# Patient Record
Sex: Female | Born: 1945 | Race: White | Hispanic: No | Marital: Married | Smoking: Never smoker
Health system: Southern US, Community
[De-identification: ages and names within clinical notes are randomized; demographics above are authoritative.]

## PROBLEM LIST (undated history)

## (undated) DIAGNOSIS — E785 Hyperlipidemia, unspecified: Secondary | ICD-10-CM

## (undated) DIAGNOSIS — I10 Essential (primary) hypertension: Secondary | ICD-10-CM

## (undated) HISTORY — PX: TONSILLECTOMY: SUR1361

## (undated) HISTORY — PX: EYE SURGERY: SHX253

## (undated) HISTORY — DX: Hyperlipidemia, unspecified: E78.5

## (undated) HISTORY — PX: TUBAL LIGATION: SHX77

## (undated) HISTORY — DX: Essential (primary) hypertension: I10

---

## 1990-06-10 HISTORY — PX: CHOLECYSTECTOMY: SHX55

## 1995-06-11 HISTORY — PX: OTHER SURGICAL HISTORY: SHX169

## 1999-07-25 ENCOUNTER — Other Ambulatory Visit: Admission: RE | Admit: 1999-07-25 | Discharge: 1999-07-25 | Payer: Self-pay | Admitting: Family Medicine

## 1999-07-30 ENCOUNTER — Encounter: Admission: RE | Admit: 1999-07-30 | Discharge: 1999-07-30 | Payer: Self-pay | Admitting: Family Medicine

## 1999-07-30 ENCOUNTER — Encounter: Payer: Self-pay | Admitting: Family Medicine

## 2000-07-17 ENCOUNTER — Encounter: Payer: Self-pay | Admitting: Family Medicine

## 2000-09-18 ENCOUNTER — Other Ambulatory Visit: Admission: RE | Admit: 2000-09-18 | Discharge: 2000-09-18 | Payer: Self-pay | Admitting: Family Medicine

## 2001-10-20 ENCOUNTER — Other Ambulatory Visit: Admission: RE | Admit: 2001-10-20 | Discharge: 2001-10-20 | Payer: Self-pay | Admitting: Family Medicine

## 2003-01-24 ENCOUNTER — Other Ambulatory Visit: Admission: RE | Admit: 2003-01-24 | Discharge: 2003-01-24 | Payer: Self-pay | Admitting: Family Medicine

## 2004-01-31 ENCOUNTER — Other Ambulatory Visit: Admission: RE | Admit: 2004-01-31 | Discharge: 2004-01-31 | Payer: Self-pay | Admitting: Family Medicine

## 2004-07-18 ENCOUNTER — Ambulatory Visit: Payer: Self-pay | Admitting: Family Medicine

## 2004-12-19 ENCOUNTER — Ambulatory Visit: Payer: Self-pay | Admitting: Family Medicine

## 2004-12-24 ENCOUNTER — Ambulatory Visit: Payer: Self-pay | Admitting: Internal Medicine

## 2005-04-05 ENCOUNTER — Ambulatory Visit: Payer: Self-pay | Admitting: Family Medicine

## 2005-04-16 ENCOUNTER — Other Ambulatory Visit: Admission: RE | Admit: 2005-04-16 | Discharge: 2005-04-16 | Payer: Self-pay | Admitting: Family Medicine

## 2005-04-16 ENCOUNTER — Encounter: Payer: Self-pay | Admitting: Family Medicine

## 2005-04-16 ENCOUNTER — Ambulatory Visit: Payer: Self-pay | Admitting: Family Medicine

## 2005-06-10 LAB — HM COLONOSCOPY: HM Colonoscopy: NORMAL

## 2005-06-24 ENCOUNTER — Ambulatory Visit: Payer: Self-pay | Admitting: Internal Medicine

## 2005-07-10 ENCOUNTER — Ambulatory Visit: Payer: Self-pay | Admitting: Internal Medicine

## 2006-04-10 ENCOUNTER — Encounter: Payer: Self-pay | Admitting: Family Medicine

## 2006-04-10 LAB — CONVERTED CEMR LAB: Pap Smear: NORMAL

## 2006-05-09 ENCOUNTER — Encounter: Payer: Self-pay | Admitting: Family Medicine

## 2006-05-09 ENCOUNTER — Other Ambulatory Visit: Admission: RE | Admit: 2006-05-09 | Discharge: 2006-05-09 | Payer: Self-pay | Admitting: Family Medicine

## 2006-05-09 ENCOUNTER — Ambulatory Visit: Payer: Self-pay | Admitting: Family Medicine

## 2007-05-21 ENCOUNTER — Encounter (INDEPENDENT_AMBULATORY_CARE_PROVIDER_SITE_OTHER): Payer: Self-pay | Admitting: *Deleted

## 2007-05-26 ENCOUNTER — Encounter: Payer: Self-pay | Admitting: Family Medicine

## 2007-05-26 DIAGNOSIS — N951 Menopausal and female climacteric states: Secondary | ICD-10-CM

## 2007-05-26 DIAGNOSIS — E785 Hyperlipidemia, unspecified: Secondary | ICD-10-CM

## 2007-05-27 ENCOUNTER — Encounter: Payer: Self-pay | Admitting: Family Medicine

## 2007-05-27 ENCOUNTER — Other Ambulatory Visit: Admission: RE | Admit: 2007-05-27 | Discharge: 2007-05-27 | Payer: Self-pay | Admitting: Family Medicine

## 2007-05-27 ENCOUNTER — Ambulatory Visit: Payer: Self-pay | Admitting: Family Medicine

## 2007-05-27 DIAGNOSIS — R82998 Other abnormal findings in urine: Secondary | ICD-10-CM

## 2007-05-27 LAB — CONVERTED CEMR LAB
Bacteria, UA: 0
Bilirubin Urine: NEGATIVE
Casts: 0 /lpf
Cholesterol, target level: 200 mg/dL
Glucose, Urine, Semiquant: NEGATIVE
HDL goal, serum: 40 mg/dL
Ketones, urine, test strip: NEGATIVE
LDL Goal: 160 mg/dL
Mucus, UA: 0
Nitrite: NEGATIVE
Specific Gravity, Urine: 1.025
Urine crystals, microscopic: 0 /hpf
Urobilinogen, UA: NEGATIVE
WBC Urine, dipstick: NEGATIVE
Yeast, UA: 0
pH: 6.5

## 2007-05-28 LAB — CONVERTED CEMR LAB
ALT: 26 units/L (ref 0–35)
AST: 8 units/L (ref 0–37)
Albumin: 4.1 g/dL (ref 3.5–5.2)
Alkaline Phosphatase: 68 units/L (ref 39–117)
BUN: 11 mg/dL (ref 6–23)
Bilirubin, Direct: 0.1 mg/dL (ref 0.0–0.3)
CO2: 32 meq/L (ref 19–32)
Calcium: 9.1 mg/dL (ref 8.4–10.5)
Chloride: 105 meq/L (ref 96–112)
Cholesterol: 129 mg/dL (ref 0–200)
Creatinine, Ser: 0.8 mg/dL (ref 0.4–1.2)
GFR calc Af Amer: 93 mL/min
GFR calc non Af Amer: 77 mL/min
Glucose, Bld: 93 mg/dL (ref 70–99)
HDL: 44.8 mg/dL (ref >39.0)
LDL Cholesterol: 59 mg/dL (ref 0–99)
Potassium: 4.5 meq/L (ref 3.5–5.1)
Sodium: 141 meq/L (ref 135–145)
Total Bilirubin: 0.8 mg/dL (ref 0.3–1.2)
Total CHOL/HDL Ratio: 2.9
Total CK: 54 units/L (ref 7–177)
Total Protein: 7.5 g/dL (ref 6.0–8.3)
Triglycerides: 124 mg/dL (ref 0–149)
VLDL: 25 mg/dL (ref 0–40)

## 2007-06-08 ENCOUNTER — Encounter: Payer: Self-pay | Admitting: Family Medicine

## 2007-06-09 ENCOUNTER — Encounter (INDEPENDENT_AMBULATORY_CARE_PROVIDER_SITE_OTHER): Payer: Self-pay | Admitting: *Deleted

## 2007-06-16 ENCOUNTER — Encounter (INDEPENDENT_AMBULATORY_CARE_PROVIDER_SITE_OTHER): Payer: Self-pay | Admitting: *Deleted

## 2007-06-17 ENCOUNTER — Encounter (INDEPENDENT_AMBULATORY_CARE_PROVIDER_SITE_OTHER): Payer: Self-pay | Admitting: *Deleted

## 2007-08-17 ENCOUNTER — Ambulatory Visit: Payer: Self-pay | Admitting: Family Medicine

## 2007-08-17 DIAGNOSIS — R05 Cough: Secondary | ICD-10-CM

## 2008-06-15 ENCOUNTER — Encounter: Payer: Self-pay | Admitting: Family Medicine

## 2008-07-18 ENCOUNTER — Ambulatory Visit: Payer: Self-pay | Admitting: Family Medicine

## 2008-07-19 DIAGNOSIS — R7303 Prediabetes: Secondary | ICD-10-CM

## 2008-07-19 LAB — CONVERTED CEMR LAB
ALT: 42 units/L — ABNORMAL HIGH (ref 0–35)
AST: 16 units/L (ref 0–37)
Albumin: 4 g/dL (ref 3.5–5.2)
Alkaline Phosphatase: 76 units/L (ref 39–117)
BUN: 10 mg/dL (ref 6–23)
Bilirubin, Direct: 0.1 mg/dL (ref 0.0–0.3)
CO2: 30 meq/L (ref 19–32)
Calcium: 9.2 mg/dL (ref 8.4–10.5)
Chloride: 105 meq/L (ref 96–112)
Cholesterol: 152 mg/dL (ref 0–200)
Creatinine, Ser: 0.7 mg/dL (ref 0.4–1.2)
GFR calc Af Amer: 109 mL/min
GFR calc non Af Amer: 90 mL/min
Glucose, Bld: 105 mg/dL — ABNORMAL HIGH (ref 70–99)
HDL: 45.8 mg/dL (ref >39.0)
LDL Cholesterol: 73 mg/dL (ref 0–99)
Potassium: 4.3 meq/L (ref 3.5–5.1)
Sodium: 140 meq/L (ref 135–145)
Total Bilirubin: 0.9 mg/dL (ref 0.3–1.2)
Total CHOL/HDL Ratio: 3.3
Total Protein: 7.5 g/dL (ref 6.0–8.3)
Triglycerides: 168 mg/dL — ABNORMAL HIGH (ref 0–149)
VLDL: 34 mg/dL (ref 0–40)

## 2008-08-30 ENCOUNTER — Other Ambulatory Visit: Admission: RE | Admit: 2008-08-30 | Discharge: 2008-08-30 | Payer: Self-pay | Admitting: Family Medicine

## 2008-08-30 ENCOUNTER — Encounter: Payer: Self-pay | Admitting: Family Medicine

## 2008-08-30 ENCOUNTER — Ambulatory Visit: Payer: Self-pay | Admitting: Family Medicine

## 2008-08-30 DIAGNOSIS — K5900 Constipation, unspecified: Secondary | ICD-10-CM

## 2008-09-01 LAB — CONVERTED CEMR LAB
Basophils Absolute: 0 10*3/uL (ref 0.0–0.1)
Basophils Relative: 0 % (ref 0.0–3.0)
Eosinophils Absolute: 0.2 10*3/uL (ref 0.0–0.7)
Eosinophils Relative: 3.2 % (ref 0.0–5.0)
HCT: 39.4 % (ref 36.0–46.0)
Hemoglobin: 14 g/dL (ref 12.0–15.0)
Lymphocytes Relative: 39.3 % (ref 12.0–46.0)
Lymphs Abs: 2.6 10*3/uL (ref 0.7–4.0)
MCHC: 35.6 g/dL (ref 30.0–36.0)
MCV: 89.3 fL (ref 78.0–100.0)
Monocytes Absolute: 0.8 10*3/uL (ref 0.1–1.0)
Monocytes Relative: 12 % (ref 3.0–12.0)
Neutro Abs: 3 10*3/uL (ref 1.4–7.7)
Neutrophils Relative %: 45.5 % (ref 43.0–77.0)
Platelets: 267 10*3/uL (ref 150.0–400.0)
RBC: 4.41 M/uL (ref 3.87–5.11)
RDW: 12.4 % (ref 11.5–14.6)
TSH: 3.85 microintl units/mL (ref 0.35–5.50)
WBC: 6.6 10*3/uL (ref 4.5–10.5)

## 2008-09-05 ENCOUNTER — Encounter (INDEPENDENT_AMBULATORY_CARE_PROVIDER_SITE_OTHER): Payer: Self-pay | Admitting: *Deleted

## 2008-12-06 ENCOUNTER — Ambulatory Visit: Payer: Self-pay | Admitting: Family Medicine

## 2008-12-06 DIAGNOSIS — L259 Unspecified contact dermatitis, unspecified cause: Secondary | ICD-10-CM

## 2009-07-19 ENCOUNTER — Encounter: Payer: Self-pay | Admitting: Family Medicine

## 2009-07-31 ENCOUNTER — Encounter (INDEPENDENT_AMBULATORY_CARE_PROVIDER_SITE_OTHER): Payer: Self-pay | Admitting: *Deleted

## 2009-08-28 ENCOUNTER — Ambulatory Visit: Payer: Self-pay | Admitting: Family Medicine

## 2009-08-30 LAB — CONVERTED CEMR LAB
ALT: 23 units/L (ref 0–35)
AST: 7 units/L (ref 0–37)
Albumin: 4.1 g/dL (ref 3.5–5.2)
Alkaline Phosphatase: 93 units/L (ref 39–117)
BUN: 14 mg/dL (ref 6–23)
Bilirubin, Direct: 0 mg/dL (ref 0.0–0.3)
CO2: 30 meq/L (ref 19–32)
Calcium: 9.1 mg/dL (ref 8.4–10.5)
Chloride: 105 meq/L (ref 96–112)
Cholesterol: 155 mg/dL (ref 0–200)
Creatinine, Ser: 0.9 mg/dL (ref 0.4–1.2)
GFR calc non Af Amer: 66.93 mL/min (ref >60)
Glucose, Bld: 91 mg/dL (ref 70–99)
HDL: 55.1 mg/dL (ref >39.00)
LDL Cholesterol: 71 mg/dL (ref 0–99)
Potassium: 4.4 meq/L (ref 3.5–5.1)
Sodium: 142 meq/L (ref 135–145)
Total Bilirubin: 0.5 mg/dL (ref 0.3–1.2)
Total CHOL/HDL Ratio: 3
Total Protein: 7.3 g/dL (ref 6.0–8.3)
Triglycerides: 145 mg/dL (ref 0.0–149.0)
VLDL: 29 mg/dL (ref 0.0–40.0)

## 2009-09-01 ENCOUNTER — Ambulatory Visit: Payer: Self-pay | Admitting: Family Medicine

## 2010-07-10 NOTE — Assessment & Plan Note (Signed)
Summary: Natasha Mendoza   Vital Signs:  Patient profile:   65 year old female Height:      67 inches Weight:      150.0 pounds BMI:     23.58 Temp:     97.9 degrees F oral Pulse rate:   72 / minute Pulse rhythm:   regular BP sitting:   140 / 80  (left arm) Cuff size:   regular  Vitals Entered By: Benny Lennert CMA Duncan Dull) (September 01, 2009 11:16 AM)  History of Present Illness: Chief complaint cpx  The patient is here for annual wellness exam and preventative care.    Ding well overall.   Elevated BPs at home 130/70s.  [employer] 3-5 days a week. Healthy diet, fruits and veggies.   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 As Needed   - About 4/week 2)  Multivitamins   Tabs (Multiple Vitamin) .... Take 1 Tablet By Mouth Once A Day 3)  Simvastatin 20 Mg  Tabs (Simvastatin) .... Take 1 Tablet By Mouth Once A Day 4)  Adult Aspirin Ec Low Strength 81 Mg  Tbec (Aspirin) .... Take 1 Tablet By Mouth Once A Day 5)  Oscal 500/200 D-3 500-200 Mg-Unit Tabs (Calcium-Vitamin D) .... Daily 6)  B Complex-Folic Acid 500-5-200 Mcg-Mg-Mcg Tabs (Folic Acid-Vit B6-Vit B12) .... Daily 7)  Fish Oil 500 Mg Caps (Omega-3 Fatty Acids) .... Take 2  Tablet By Mouth Every 12 Hours 8)  Prednisone (Pak) 10 Mg Tabs (Prednisone) .... Use As Directed X 6days  Allergies: 1)  ! Erythromycin  Past History:  Past medical, surgical, family and social histories (including risk factors) reviewed, and no changes noted (except as noted below).  Past Medical  History: Reviewed history from 05/26/2007 and no changes required. Hyperlipidemia  Past Surgical History: Reviewed history from 05/26/2007 and no changes required. 1992    cholecystectomy 1997    right benign breast cyst             tonsillectomy  Family History: Reviewed history from 05/26/2007 and no changes required. Father: Died age 79 massive MI, high chol. Mother: Alive CVA Siblings:  No DM  Social History: Reviewed history from 05/27/2007 and no changes required. Marital Status: Married Children:  Occupation: Retired Exercise:  Elliptical  5 days per week  Review of Systems General:  Denies fatigue and fever. CV:  Denies chest pain or discomfort. Resp:  Denies shortness of breath, sputum productive, and wheezing. GI:  Denies abdominal pain, bloody stools, constipation, and diarrhea. GU:  Denies dysuria. Derm:  Denies rash. Psych:  Denies anxiety and depression. Endo:  Denies cold intolerance and heat intolerance.  Physical Exam  General:  Well-developed,well-nourished,in no acute distress; alert,appropriate and cooperative throughout examination Eyes:  No corneal or conjunctival inflammation noted. EOMI. Perrla. Funduscopic exam benign, without hemorrhages, exudates or papilledema. Vision grossly normal. Ears:  External ear exam shows no significant lesions or deformities.  Otoscopic examination reveals clear canals, tympanic membranes are intact bilaterally without bulging, retraction, inflammation or discharge. Hearing is grossly normal bilaterally. Nose:  External nasal examination  shows no deformity or inflammation. Nasal mucosa are pink and moist without lesions or exudates. Mouth:  Oral mucosa and oropharynx without lesions or exudates.  Teeth in good repair. Neck:  no carotid bruit or thyromegaly no cervical or supraclavicular lymphadenopathy  Chest Wall:  No deformities, masses, or tenderness noted. Breasts:  No mass, nodules, thickening, tenderness, bulging,  retraction, inflamation, nipple discharge or skin changes noted.   Lungs:  Normal respiratory effort, chest expands symmetrically. Lungs are clear to auscultation, no crackles or wheezes. Heart:  Normal rate and regular rhythm. S1 and S2 normal without gallop, murmur, click, rub or other extra sounds. Abdomen:  Bowel sounds positive,abdomen soft and non-tender without masses, organomegaly or hernias noted. Genitalia:  not indicated Msk:  No deformity or scoliosis noted of thoracic or lumbar spine.   Pulses:  R and L posterior tibial pulses are full and equal bilaterally  Extremities:  no edema  Skin:  Intact without suspicious lesions or rashes Psych:  Cognition and judgment appear intact. Alert and cooperative with normal attention span and concentration. No apparent delusions, illusions, hallucinations   Impression & Recommendations:  Problem # 1:  HEALTH MAINTENANCE EXAM (ICD-V70.0) The patient's preventative maintenance and recommended screening tests for an annual wellness exam were reviewed in full today. Brought up to date unless services declined.  Counselled on the importance of diet, exercise, and its role in overall health and mortality. The patient's FH and SH was reviewed, including their home life, tobacco status, and drug and alcohol status.     Complete Medication List: 1)  Eq Fiber Therapy 0.52 Gm Caps (Psyllium) .... As needed   - about 4/week 2)  Multivitamins Tabs (Multiple vitamin) .... Take 1 tablet by mouth once a day 3)  Simvastatin 20 Mg Tabs (Simvastatin) .... Take 1 tablet by mouth once a day 4)  Adult Aspirin Ec Low Strength 81 Mg Tbec (Aspirin) .... Take 1 tablet by mouth once a day 5)  Oscal 500/200 D-3 500-200 Mg-unit Tabs (Calcium-vitamin d) .... Daily 6)  B Complex-folic Acid 500-5-200 Mcg-mg-mcg Tabs (Folic acid-vit b6-vit b12) .... Daily 7)  Fish Oil 500 Mg Caps (Omega-3 fatty acids) .... Take 2  tablet by mouth every 12 hours 8)  Prednisone (pak) 10  Mg Tabs (Prednisone) .... Use as directed x 6days  Patient Instructions: 1)  Next year when scehduling mammogram..schedule bone density.   2)  Please schedule a follow-up appointment in 1 year.   Current Allergies (reviewed today): ! ERYTHROMYCIN  Last Flu Vaccine:  refused (05/27/2007 10:42:56 AM) Flu Vaccine Next Due:  Refused Last TD:  refused (05/27/2007 10:42:56 AM) TD Next Due:  Refused Herpes Zoster Next Due:  Refused Last PAP:  NEGATIVE FOR INTRAEPITHELIAL LESIONS OR MALIGNANCY. (08/30/2008 12:00:00 AM) PAP Next Due:  2 yr

## 2010-07-10 NOTE — Letter (Signed)
Summary: Results Follow up Letter  [facility]  [address] [place], [place] [address]   Phone: [phone]  Fax: [phone]    07/31/2009 MRN: [id number]     CONCHA SUDOL [address] [place], [place]  [address]    Dear Ms. Ty Hilts,  The following are the results of your recent test(s):  Test         Result    Pap Smear:        Normal _____  Not Normal _____ Comments: ______________________________________________________ Cholesterol: LDL(Bad cholesterol):         Your goal is less than:         HDL (Good cholesterol):       Your goal is more than: Comments:  ______________________________________________________ Mammogram:        Normal __x___  Not Normal _____ Comments:Repeat in 1 year  ___________________________________________________________________ Hemoccult:        Normal _____  Not normal _______ Comments:    _____________________________________________________________________ Other Tests:    We routinely do not discuss normal results over the telephone.  If you desire a copy of the results, or you have any questions about this information we can discuss them at your next office visit.   Sincerely,   Kerby Nora MD

## 2010-10-29 ENCOUNTER — Other Ambulatory Visit: Payer: Self-pay | Admitting: *Deleted

## 2010-10-29 MED ORDER — SIMVASTATIN 20 MG PO TABS: 20.0000 mg | ORAL_TABLET | Freq: Every day | ORAL | Status: DC

## 2010-12-11 ENCOUNTER — Telehealth: Payer: Self-pay | Admitting: Family Medicine

## 2010-12-11 DIAGNOSIS — E785 Hyperlipidemia, unspecified: Secondary | ICD-10-CM

## 2010-12-11 DIAGNOSIS — R7309 Other abnormal glucose: Secondary | ICD-10-CM

## 2010-12-11 NOTE — Telephone Encounter (Signed)
Message copied by Excell Seltzer on Tue Dec 11, 2010  1:49 PM ------      Message from: Baldomero Lamy      Created: Tue Dec 11, 2010 12:01 PM      Regarding: Cpx labs fri       Please order  future cpx labs for pt's upcomming lab appt.      Thanks      Rodney Booze

## 2010-12-12 ENCOUNTER — Encounter: Payer: Self-pay | Admitting: Family Medicine

## 2010-12-13 LAB — HM MAMMOGRAPHY: HM Mammogram: NORMAL

## 2010-12-14 ENCOUNTER — Other Ambulatory Visit (INDEPENDENT_AMBULATORY_CARE_PROVIDER_SITE_OTHER): Payer: Medicare Other | Admitting: Family Medicine

## 2010-12-14 DIAGNOSIS — E785 Hyperlipidemia, unspecified: Secondary | ICD-10-CM

## 2010-12-14 DIAGNOSIS — R7309 Other abnormal glucose: Secondary | ICD-10-CM

## 2010-12-14 LAB — COMPREHENSIVE METABOLIC PANEL
ALT: 23 U/L (ref 0–35)
Albumin: 4.5 g/dL (ref 3.5–5.2)
CO2: 30 mEq/L (ref 19–32)
Calcium: 9 mg/dL (ref 8.4–10.5)
Chloride: 107 mEq/L (ref 96–112)
GFR: 76.36 mL/min (ref >60.00)
Glucose, Bld: 102 mg/dL — ABNORMAL HIGH (ref 70–99)
Potassium: 4.5 mEq/L (ref 3.5–5.1)
Sodium: 141 mEq/L (ref 135–145)
Total Bilirubin: 0.6 mg/dL (ref 0.3–1.2)
Total Protein: 7.5 g/dL (ref 6.0–8.3)

## 2010-12-14 LAB — LIPID PANEL: VLDL: 24.6 mg/dL (ref 0.0–40.0)

## 2010-12-18 ENCOUNTER — Ambulatory Visit (INDEPENDENT_AMBULATORY_CARE_PROVIDER_SITE_OTHER): Payer: Medicare Other | Admitting: Family Medicine

## 2010-12-18 ENCOUNTER — Encounter: Payer: Self-pay | Admitting: Family Medicine

## 2010-12-18 DIAGNOSIS — Z0000 Encounter for general adult medical examination without abnormal findings: Secondary | ICD-10-CM

## 2010-12-18 DIAGNOSIS — R7309 Other abnormal glucose: Secondary | ICD-10-CM

## 2010-12-18 DIAGNOSIS — Z01419 Encounter for gynecological examination (general) (routine) without abnormal findings: Secondary | ICD-10-CM

## 2010-12-18 DIAGNOSIS — E785 Hyperlipidemia, unspecified: Secondary | ICD-10-CM

## 2010-12-18 DIAGNOSIS — N951 Menopausal and female climacteric states: Secondary | ICD-10-CM

## 2010-12-18 DIAGNOSIS — Z780 Asymptomatic menopausal state: Secondary | ICD-10-CM

## 2010-12-18 DIAGNOSIS — Z124 Encounter for screening for malignant neoplasm of cervix: Secondary | ICD-10-CM

## 2010-12-18 MED ORDER — SERTRALINE HCL 25 MG PO TABS: 25.0000 mg | ORAL_TABLET | Freq: Every day | ORAL | Status: DC

## 2010-12-18 NOTE — Patient Instructions (Addendum)
Decrease sweet fruits , carbohydrates. Stop all soda, juice. Follow BP at home.. Goal BP <140/90. Stop at front desk to schedule bone density.  Start sertraline for menopausal symptoms, call if no improvement in 1 month. If any side effects, given at least 1 week to let body get used to medication. Call if interested in stopping the medication. Do not stop it all of a sudden.

## 2010-12-18 NOTE — Assessment & Plan Note (Signed)
Not interested in HRT given Cardiac risk. HAs tried multiple OTC medicaitons without relief. We will start SSRI to treat.

## 2010-12-18 NOTE — Assessment & Plan Note (Signed)
Well controlled. Continue current medication. Liver function nml.

## 2010-12-18 NOTE — Progress Notes (Signed)
Subjective:    Patient ID: Natasha Mendoza, female    DOB: 05-14-1946, 65 y.o.   MRN: [id number]  HPI I have personally reviewed the Medicare Annual Wellness questionnaire and have noted 1. The patient's medical and social history 2. Their use of alcohol, tobacco or illicit drugs 3. Their current medications and supplements 4. The patient's functional ability including ADL's, fall risks, home safety risks and hearing or visual             impairment. 5. Diet and physical activities 6. Evidence for depression or mood disorders The patients weight, height, BMI and visual acuity have been recorded in the chart I have made referrals, counseling and provided education to the patient based review of the above and I have provided the pt with a written personalized care plan for preventive services.  Elevated Cholesterol: Well controlled on simvastatin 20 mg daily Using medications without problems: yes Muscle aches: None  Prediabetes, mild  Glucose 102   Menopausal syndrome: Difficulty sleeping, fatigue all due to  hot flashes.  .    Review of Systems  Constitutional: Negative for fever and fatigue.  HENT: Negative for ear pain.   Eyes: Negative for pain.  Respiratory: Negative for chest tightness and shortness of breath.   Cardiovascular: Negative for chest pain, palpitations and leg swelling.  Gastrointestinal: Positive for constipation. Negative for abdominal pain.  Genitourinary: Negative for dysuria, hematuria, vaginal bleeding, vaginal discharge, vaginal pain and pelvic pain.  Neurological: Negative for syncope.       Objective:   Physical Exam  Constitutional: Vital signs are normal. She appears well-developed and well-nourished. She is cooperative.  Non-toxic appearance. She does not appear ill. No distress.  HENT:  Head: Normocephalic.  Right Ear: Hearing, tympanic membrane, external ear and ear canal normal.  Left Ear: Hearing, tympanic membrane, external ear and  ear canal normal.  Nose: Nose normal.  Eyes: Conjunctivae, EOM and lids are normal. Pupils are equal, round, and reactive to light. No foreign bodies found.  Neck: Trachea normal and normal range of motion. Neck supple. Carotid bruit is not present. No mass and no thyromegaly present.  Cardiovascular: Normal rate, regular rhythm, S1 normal, S2 normal, normal heart sounds and intact distal pulses.  Exam reveals no gallop.   No murmur heard. Pulmonary/Chest: Effort normal and breath sounds normal. No respiratory distress. She has no wheezes. She has no rhonchi. She has no rales.  Abdominal: Soft. Normal appearance and bowel sounds are normal. She exhibits no distension, no fluid wave, no abdominal bruit and no mass. There is no hepatosplenomegaly. There is no tenderness. There is no rebound, no guarding and no CVA tenderness. No hernia.  Genitourinary: Rectum normal, vagina normal and uterus normal. No breast swelling, tenderness, discharge or bleeding. Pelvic exam was performed with patient prone. There is no rash, tenderness or lesion on the right labia. There is no rash, tenderness or lesion on the left labia. Uterus is not enlarged and not tender. Cervix exhibits no motion tenderness. Right adnexum displays no mass, no tenderness and no fullness. Left adnexum displays no mass, no tenderness and no fullness.       No pap performed  Lymphadenopathy:    She has no cervical adenopathy.    She has no axillary adenopathy.  Neurological: She is alert. She has normal strength. No cranial nerve deficit or sensory deficit.  Skin: Skin is warm, dry and intact. No rash noted.  Psychiatric: Her speech is normal and behavior is  normal. Judgment normal. Her mood appears not anxious. Cognition and memory are normal. She does not exhibit a depressed mood.          Assessment & Plan:  Annual Medicare Wellness: The patient's preventative maintenance and recommended screening tests for an annual wellness exam  were reviewed in full today. Brought up to date unless services declined.  Counselled on the importance of diet, exercise, and its role in overall health and mortality. The patient's FH and SH was reviewed, including their home life, tobacco status, and drug and alcohol status.   Last DEXA 2008 will schedule. LAst Colon 2007 Refuses any vaccines   recne mammogram nml.

## 2010-12-18 NOTE — Assessment & Plan Note (Signed)
Discussed diet and lifestyle changes to improve glucose. Rechek in 1 year.

## 2010-12-27 ENCOUNTER — Encounter: Payer: Self-pay | Admitting: Family Medicine

## 2011-01-01 LAB — HM DEXA SCAN

## 2011-01-04 ENCOUNTER — Encounter: Payer: Self-pay | Admitting: Family Medicine

## 2011-01-04 DIAGNOSIS — M810 Age-related osteoporosis without current pathological fracture: Secondary | ICD-10-CM | POA: Insufficient documentation

## 2011-01-04 DIAGNOSIS — M8580 Other specified disorders of bone density and structure, unspecified site: Secondary | ICD-10-CM

## 2011-01-15 ENCOUNTER — Telehealth: Payer: Self-pay | Admitting: *Deleted

## 2011-01-15 ENCOUNTER — Encounter: Payer: Self-pay | Admitting: *Deleted

## 2011-01-15 NOTE — Telephone Encounter (Signed)
Opened in error

## 2011-01-22 ENCOUNTER — Telehealth: Payer: Self-pay | Admitting: *Deleted

## 2011-01-22 MED ORDER — SERTRALINE HCL 50 MG PO TABS: 50.0000 mg | ORAL_TABLET | Freq: Every day | ORAL | Status: DC

## 2011-01-22 NOTE — Telephone Encounter (Signed)
The patient says she was prescribed Sertraline 25 mg about a month ago and was told to call in to give a report on her progress.  She does not feel that it is helping the post-menopausal symptoms at all.  She is due to have it refilled in about 5 days and if the dosage needs to be increased, she would like to have the new Rx prior to refilling.  Financial planner.

## 2011-01-22 NOTE — Telephone Encounter (Signed)
Increase to 50 mg daily. Sent in new Rx.

## 2011-01-23 NOTE — Telephone Encounter (Signed)
Patient advised.

## 2011-02-21 ENCOUNTER — Telehealth: Payer: Self-pay

## 2011-02-21 MED ORDER — SERTRALINE HCL 100 MG PO TABS: 100.0000 mg | ORAL_TABLET | Freq: Every day | ORAL | Status: DC

## 2011-02-21 NOTE — Telephone Encounter (Signed)
Increase to 100 mg for 1 month, call if not improved on this max dose, for change in med.

## 2011-02-21 NOTE — Telephone Encounter (Signed)
Patient advised.

## 2011-02-21 NOTE — Telephone Encounter (Signed)
Pt was taking Sertraline 25 mg for 1 month then med increased to Sertraline 50 mg for 1 month for menopausal symptoms and pt has  3 pills left and pt cannot see any improvement. Pt would like Dr Ermalene Searing to advise if need to  change dosage or change to a different med. Pt uses [facility] if pharmacy needed. Pt can be reached at [phone].

## 2011-03-04 ENCOUNTER — Encounter: Payer: Self-pay | Admitting: Family Medicine

## 2011-03-04 ENCOUNTER — Ambulatory Visit (INDEPENDENT_AMBULATORY_CARE_PROVIDER_SITE_OTHER): Payer: Medicare Other | Admitting: Family Medicine

## 2011-03-04 VITALS — BP 120/74 | HR 60 | Temp 98.0°F | Wt 158.2 lb

## 2011-03-04 DIAGNOSIS — R109 Unspecified abdominal pain: Secondary | ICD-10-CM

## 2011-03-04 LAB — CBC WITH DIFFERENTIAL/PLATELET
Basophils Relative: 0.5 % (ref 0.0–3.0)
Eosinophils Relative: 3.6 % (ref 0.0–5.0)
HCT: 43.7 % (ref 36.0–46.0)
Hemoglobin: 14.4 g/dL (ref 12.0–15.0)
Lymphs Abs: 2.7 10*3/uL (ref 0.7–4.0)
MCV: 94.3 fl (ref 78.0–100.0)
Monocytes Absolute: 0.9 10*3/uL (ref 0.1–1.0)
Monocytes Relative: 9.3 % (ref 3.0–12.0)
Neutro Abs: 5.4 10*3/uL (ref 1.4–7.7)
RBC: 4.63 Mil/uL (ref 3.87–5.11)
WBC: 9.3 10*3/uL (ref 4.5–10.5)

## 2011-03-04 LAB — HEPATIC FUNCTION PANEL
ALT: 42 U/L — ABNORMAL HIGH (ref 0–35)
AST: 14 U/L (ref 0–37)
Total Bilirubin: 0.8 mg/dL (ref 0.3–1.2)
Total Protein: 7.5 g/dL (ref 6.0–8.3)

## 2011-03-04 LAB — LIPASE: Lipase: 58 U/L (ref 11.0–59.0)

## 2011-03-04 NOTE — Progress Notes (Signed)
Subjective:    Patient ID: Natasha Mendoza, female    DOB: 1946-05-13, 65 y.o.   MRN: [id number]  HPI  Natasha Mendoza, a 65 y.o. female presents today in the office for the following:    Abdominal pain for about the last 2 weeks, primarily in the right upper quadrant as well as in the epigastric region. She has had 4 distinct episodes all in the epigastric and right upper quadrant region. She believes that this feels similar to her wall bladder pain that she had 20 years ago with before she had a cholecystectomy. She has not had any bloody stools or melena. No significant NSAID use, but does take a baby aspirin daily. No vomiting. No diarrhea. No fever. She is able to eat and drink, but her desire to eat and drink is decreased.  Had her gallbladder out about twenty years ago -- has had about four more attcks and pain in her abdomen. Has had to sit up and all in her upper abdomen. Constant pain and feeling full in her upper abdomen. Bra feels uncomfortable.   4 sharp attacks in RUQ.  Gallbladder out.   Feels hungry but feels full fast. No fever or chills.   Patient Active Problem List  Diagnoses  . HYPERLIPIDEMIA  . CONSTIPATION, CHRONIC  . MENOPAUSAL SYNDROME  . PREDIABETES  . URINALYSIS, ABNORMAL  . Osteopenia   Past Medical History  Diagnosis Date  . Hyperlipidemia    Past Surgical History  Procedure Date  . Cholecystectomy 1992  . Right benign breast cyst 1997  . Tonsillectomy    History  Substance Use Topics  . Smoking status: Never Smoker   . Smokeless tobacco: Not on file  . Alcohol Use: Not on file   Family History  Problem Relation Age of Onset  . Heart attack Father 82    massive  . Hyperlipidemia Father   . Stroke Mother   . Diabetes Neg Hx    Allergies  Allergen Reactions  . Erythromycin    Current Outpatient Prescriptions on File Prior to Visit  Medication Sig Dispense Refill  . aspirin 81 MG tablet Take 81 mg by mouth daily.        .  calcium-vitamin D (OSCAL WITH D) 500-200 MG-UNIT per tablet Take 1 tablet by mouth daily.        . Methylcellulose, Laxative, (FIBER THERAPY PO) Take 1 capsule by mouth. 4 times a week       . sertraline (ZOLOFT) 100 MG tablet Take 1 tablet (100 mg total) by mouth daily. For menopausal symptoms  30 tablet  5  . simvastatin (ZOCOR) 20 MG tablet Take 1 tablet (20 mg total) by mouth at bedtime.  30 tablet  11  . fish oil-omega-3 fatty acids 1000 MG capsule Take 2 g by mouth every 12 (twelve) hours.        . Folic Acid-Vit B6-Vit B12 (B COMPLEX-FOLIC ACID) 500-5-200 MCG-MG-MCG TABS Take 1 tablet by mouth daily.        . Multiple Vitamin (MULTIVITAMIN PO) Take 1 tablet by mouth daily.           Review of Systems ROS: GEN: Acute illness details above GI: Tolerating PO intake GU: maintaining adequate hydration and urination Pulm: No SOB Interactive and getting along well at home.  Otherwise, ROS is as per the HPI. k    Objective:   Physical Exam   Physical Exam  Blood pressure 120/74, pulse 60, temperature 98 F (  36.7 C), temperature source Oral, weight 158 lb 4 oz (71.782 kg).  GEN: WDWN, NAD, Non-toxic, A & O x 3 HEENT: Atraumatic, Normocephalic. Neck supple. No masses, No LAD. Ears and Nose: No external deformity. CV: RRR, No M/G/R. No JVD. No thrill. No extra heart sounds. PULM: CTA B, no wheezes, crackles, rhonchi. No retractions. No resp. distress. No accessory muscle use. ABD: S, mod tenderness RUQ and epigastric region, ND, +BS. No rebound tenderness. No HSM.  EXTR: No c/c/e NEURO Normal gait.  PSYCH: Normally interactive. Conversant. Not depressed or anxious appearing.  Calm demeanor.        Assessment & Plan:   1. Abdominal  pain, other specified site  Basic metabolic panel, CBC with Differential, Hepatic function panel, Lipase, H. pylori antibody, IgG, CT Abdomen Pelvis W Contrast    Abdominal pain of unclear source. Check basic laboratories. Given her symptoms of 2  weeks, and examination, check a CT of the abdomen and pelvis with contrast to evaluate for occult pathology.

## 2011-03-04 NOTE — Patient Instructions (Signed)
Stop aspirin Pepcid or Zantac twice a day  REFERRAL: GO THE THE FRONT ROOM AT THE ENTRANCE OF OUR CLINIC, NEAR CHECK IN. ASK FOR MARION. SHE WILL HELP YOU SET UP YOUR REFERRAL. DATE: TIME:

## 2011-03-05 LAB — BASIC METABOLIC PANEL
BUN: 16 mg/dL (ref 6–23)
Chloride: 101 mEq/L (ref 96–112)
GFR: 75.22 mL/min (ref >60.00)
Potassium: 5 mEq/L (ref 3.5–5.1)
Sodium: 139 mEq/L (ref 135–145)

## 2011-03-05 LAB — H. PYLORI ANTIBODY, IGG: H Pylori IgG: NEGATIVE

## 2011-03-06 ENCOUNTER — Ambulatory Visit (INDEPENDENT_AMBULATORY_CARE_PROVIDER_SITE_OTHER)
Admission: RE | Admit: 2011-03-06 | Discharge: 2011-03-06 | Disposition: A | Discharge status: Home / Self Care | Payer: Medicare Other | Source: Ambulatory Visit | Attending: Family Medicine | Admitting: Family Medicine

## 2011-03-06 ENCOUNTER — Other Ambulatory Visit: Payer: Self-pay | Admitting: Family Medicine

## 2011-03-06 DIAGNOSIS — R1011 Right upper quadrant pain: Secondary | ICD-10-CM

## 2011-03-06 DIAGNOSIS — R109 Unspecified abdominal pain: Secondary | ICD-10-CM

## 2011-03-06 MED ORDER — IOHEXOL 300 MG/ML  SOLN: 100.0000 mL | Freq: Once | INTRAMUSCULAR | Status: AC | PRN

## 2011-03-21 ENCOUNTER — Encounter: Payer: Self-pay | Admitting: Family Medicine

## 2011-03-21 ENCOUNTER — Ambulatory Visit (INDEPENDENT_AMBULATORY_CARE_PROVIDER_SITE_OTHER): Payer: Medicare Other | Admitting: Family Medicine

## 2011-03-21 DIAGNOSIS — R109 Unspecified abdominal pain: Secondary | ICD-10-CM

## 2011-03-21 DIAGNOSIS — R1013 Epigastric pain: Secondary | ICD-10-CM | POA: Insufficient documentation

## 2011-03-21 LAB — POCT URINALYSIS DIPSTICK
Blood, UA: NEGATIVE
Glucose, UA: NEGATIVE
Nitrite, UA: NEGATIVE
Spec Grav, UA: 1.02
Urobilinogen, UA: NEGATIVE
pH, UA: 7.5

## 2011-03-21 NOTE — Assessment & Plan Note (Addendum)
Given flank pain and urinary odor.. Will eval UA... Does not sound like typical urinary infection.  UA clear today.

## 2011-03-21 NOTE — Patient Instructions (Addendum)
Keep appt with Dr. Juanda Chance for further GI eval.  Stay off zoloft , fish oil ,asa for now... Will readdress menopausal symptoms and consider med restart once GI eval completed.

## 2011-03-21 NOTE — Progress Notes (Signed)
  Subjective:    Patient ID: Natasha Mendoza, female    DOB: 04-Jan-1946, 65 y.o.   MRN: [id number]  HPI   65 year old female with history of chronic constipation presents with fullness in epigastrum not related to eating.. Feels bloated. Mild epigastric pain, some pain in B flank pain mild. No further attacks as she was having after eating. She was seen 9/24 by Dr. Patsy Lager for right upper quadrant and epigastric pain... Labs including CMET, cbc, lipase and Hpylori were negative. Abd Pelvic CT was normal.  She has hx of cholescytectomy.  No dysuria, no urinary frequency. Has noted urine odor.  No SOB. No fever, no unexpected weight loss, no fatigue.    She has upcoming appt with Dr. Juanda Chance 10/28thish.  She noted the pain begin 1 week after increasing zoloft to 100 mg daily. She stopped zoloft, aspirin.   2 days after stopping the meds she had no further.  She is doing moderately fine with menopausal symptoms off zoloft...once other issues resolved we can consider other possible treatments for hot flashes.    Review of Systems  Constitutional: Negative for fever and fatigue.  HENT: Negative for ear pain.   Eyes: Negative for pain.  Respiratory: Negative for shortness of breath and wheezing.   Cardiovascular: Negative for chest pain, palpitations and leg swelling.  Genitourinary: Negative for dysuria, urgency, vaginal bleeding, vaginal discharge, vaginal pain and pelvic pain.       Objective:   Physical Exam  Constitutional: Vital signs are normal. She appears well-developed and well-nourished. She is cooperative.  Non-toxic appearance. She does not appear ill. No distress.  HENT:  Right Ear: Hearing, tympanic membrane and ear canal normal. Tympanic membrane is not erythematous, not retracted and not bulging.  Left Ear: Hearing, tympanic membrane and ear canal normal. Tympanic membrane is not erythematous, not retracted and not bulging.  Nose: No mucosal edema or rhinorrhea.  Right sinus exhibits no maxillary sinus tenderness and no frontal sinus tenderness. Left sinus exhibits no maxillary sinus tenderness and no frontal sinus tenderness.  Mouth/Throat: Uvula is midline and mucous membranes are normal.  Eyes: Lids are normal. No foreign bodies found.  Neck: Trachea normal. Carotid bruit is not present. No mass and no thyromegaly present.  Cardiovascular: Normal rate, regular rhythm, S1 normal, S2 normal, normal heart sounds, intact distal pulses and normal pulses.  Exam reveals no gallop and no friction rub.   No murmur heard. Pulmonary/Chest: Effort normal and breath sounds normal. Not tachypneic. No respiratory distress. She has no decreased breath sounds. She has no wheezes. She has no rhonchi. She has no rales.  Abdominal: Soft. Normal appearance and bowel sounds are normal. She exhibits no distension, no ascites and no mass. There is no hepatosplenomegaly. There is tenderness in the epigastric area. There is no CVA tenderness.  Neurological: She is alert.  Skin: Skin is warm, dry and intact. No rash noted.  Psychiatric: Her speech is normal and behavior is normal. Judgment and thought content normal. Her mood appears not anxious. Cognition and memory are normal. She does not exhibit a depressed mood.          Assessment & Plan:

## 2011-03-21 NOTE — Assessment & Plan Note (Signed)
Agree that this appears to be GI in origin... Symptomatology corresponds. Recommended keeping appt with Dr. Juanda Chance for likely endoscopy. Stay off meds including asa, zoloft as may have irritated stomach.

## 2011-04-08 ENCOUNTER — Encounter: Payer: Self-pay | Admitting: Internal Medicine

## 2011-04-08 ENCOUNTER — Ambulatory Visit (INDEPENDENT_AMBULATORY_CARE_PROVIDER_SITE_OTHER): Payer: Medicare Other | Admitting: Internal Medicine

## 2011-04-08 DIAGNOSIS — R1031 Right lower quadrant pain: Secondary | ICD-10-CM

## 2011-04-08 DIAGNOSIS — R1013 Epigastric pain: Secondary | ICD-10-CM

## 2011-04-08 MED ORDER — OMEPRAZOLE MAGNESIUM 20 MG PO TBEC: 20.0000 mg | DELAYED_RELEASE_TABLET | Freq: Every day | ORAL | Status: DC

## 2011-04-08 NOTE — Patient Instructions (Signed)
You have been scheduled for an endoscopy. Please follow written instructions given to you at your visit today. We have given you samples of Prilosec to take once daily.

## 2011-04-08 NOTE — Progress Notes (Signed)
Natasha Mendoza [birth year] MRN [id number]    History of Present Illness:  This is a 65 year old white female with a recent onset of subxiphoid and epigastric pain. The attack occurred in September 2012 and although she has gotten much better, she still has almost continuous discomfort in the substernal area. She has tried over-the-counter Pepcid without much improvement. She denies dysphagia or odynophagia. She denies using anti-inflammatory agents. The pain resembles her prior gallbladder attacks. She had a laparoscopic cholecystectomy by Dr Orpah Greek about 20 years ago. She had an ERCP by Dr. Jarold Motto in 1988 which was unremarkable. She had a screening colonoscopy in 2007 which was normal. A CT scan of the abdomen last month was negative.   Past Medical History  Diagnosis Date  . Hyperlipidemia   . Hemorrhoids   . Upper abdominal pain   . Constipation    Past Surgical History  Procedure Date  . Cholecystectomy 1992  . Right benign breast cyst 1997  . Tonsillectomy     reports that she has never smoked. She has never used smokeless tobacco. She reports that she does not drink alcohol or use illicit drugs. family history includes Heart attack (age of onset:39) in her father; Hyperlipidemia in her father; and Stroke in her mother.  There is no history of Diabetes and Colon cancer. Allergies  Allergen Reactions  . Erythromycin         Review of Systems:  The remainder of the 10 point ROS is negative except as outlined in H&P   Physical Exam: General appearance  Well developed, in no distress. Eyes- non icteric. HEENT nontraumatic, normocephalic. Mouth no lesions, tongue papillated, no cheilosis. Neck supple without adenopathy, thyroid not enlarged, no carotid bruits, no JVD. Lungs Clear to auscultation bilaterally. Cor normal S1, normal S2, regular rhythm, no murmur,  quiet precordium. Abdomen: Soft abdomen with minimal tenderness in the subxiphoid area, normal active  bowel sounds. No distention. Liver edge at costal margin. Rectal: Not done. Extremities no pedal edema. Skin no lesions. Neurological alert and oriented x 3. Psychological normal mood and affect.  Assessment and Plan:  Problem #1 Epigastric discomfort likely due to gastritis, hiatal hernia or hyperacidity. We will schedule her for an upper endoscopy and start her on samples of Prilosec 20 mg daily. She will follow antireflux measures. If symptoms don't improve, we will consider repeating an upper abdominal ultrasound to look for choledocholithiasis. A recent CT scan showed normal bile duct.   04/08/2011 Natasha Mendoza

## 2011-04-09 ENCOUNTER — Encounter: Payer: Self-pay | Admitting: Internal Medicine

## 2011-04-09 ENCOUNTER — Ambulatory Visit (AMBULATORY_SURGERY_CENTER): Payer: Medicare Other | Admitting: Internal Medicine

## 2011-04-09 VITALS — BP 153/79 | HR 61 | Temp 97.5°F | Resp 17 | Ht 66.0 in | Wt 162.0 lb

## 2011-04-09 DIAGNOSIS — K209 Esophagitis, unspecified without bleeding: Secondary | ICD-10-CM

## 2011-04-09 DIAGNOSIS — R1031 Right lower quadrant pain: Secondary | ICD-10-CM

## 2011-04-09 DIAGNOSIS — R1013 Epigastric pain: Secondary | ICD-10-CM

## 2011-04-09 DIAGNOSIS — K222 Esophageal obstruction: Secondary | ICD-10-CM

## 2011-04-09 DIAGNOSIS — K2100 Gastro-esophageal reflux disease with esophagitis, without bleeding: Secondary | ICD-10-CM

## 2011-04-09 MED ORDER — OMEPRAZOLE-SODIUM BICARBONATE 40-1100 MG PO CAPS: 1.0000 | ORAL_CAPSULE | Freq: Every day | ORAL | Status: DC

## 2011-04-09 MED ORDER — SODIUM CHLORIDE 0.9 % IV SOLN: 500.0000 mL | INTRAVENOUS | Status: DC

## 2011-04-09 NOTE — Patient Instructions (Signed)
FOLLOW YOUR DISCHARGE INSTRUCTIONS ON THE GREEN AND BLUE INSTRUCTION SHEETS.  FOLLOW THE POST DILATATION DIET:   NOTHING TO EAT OR DRINK UNTIL 1230 PM TODAY.   AFTER 1230 UNTIL 130 PM CLEAR LIQUIDS ONLY.   AFTER 130 PM FOR THE REMAINDER OF THE DAY SOFT FOODS ONLY.  CONTINUE YOUR MEDICATIONS. REPLACING ZEGRID 40 MG DAILY INSTEAD OF PRILOSEC 20 MG.  AWAIT BIOPSY RESULTS. DR. Juanda Chance WILL SEND YOU A LETTER WITH PATHOLOGY RESULTS.

## 2011-04-10 ENCOUNTER — Telehealth: Payer: Self-pay | Admitting: *Deleted

## 2011-04-10 NOTE — Telephone Encounter (Signed)
Follow up Call- Patient questions:  Do you have a fever, pain , or abdominal swelling? no Pain Score  0 *  Have you tolerated food without any problems? yes  Have you been able to return to your normal activities? yes  Do you have any questions about your discharge instructions: Diet   no Medications  no Follow up visit  no  Do you have questions or concerns about your Care? no  Actions: * If pain score is 4 or above: No action needed, pain <4. Patient stating able to swallow well yesterday, no appetite yesterday, has not eaten yet today. Denies any problems or concerns.

## 2011-04-15 ENCOUNTER — Encounter: Payer: Self-pay | Admitting: Internal Medicine

## 2011-10-28 ENCOUNTER — Other Ambulatory Visit: Payer: Self-pay | Admitting: *Deleted

## 2011-10-28 MED ORDER — SIMVASTATIN 20 MG PO TABS: 20.0000 mg | ORAL_TABLET | Freq: Every day | ORAL | Status: DC

## 2012-01-12 ENCOUNTER — Telehealth: Payer: Self-pay | Admitting: Family Medicine

## 2012-01-12 DIAGNOSIS — M8580 Other specified disorders of bone density and structure, unspecified site: Secondary | ICD-10-CM

## 2012-01-12 DIAGNOSIS — E785 Hyperlipidemia, unspecified: Secondary | ICD-10-CM

## 2012-01-12 NOTE — Telephone Encounter (Signed)
Message copied by Excell Seltzer on Sun Jan 12, 2012 11:37 PM ------      Message from: Alvina Chou      Created: Tue Jan 07, 2012  4:42 PM      Regarding: Lab orders for Tuesday, 8.6.13       Patient is scheduled for CPX labs, please order future labs, Thanks , Camelia Eng

## 2012-01-14 ENCOUNTER — Other Ambulatory Visit (INDEPENDENT_AMBULATORY_CARE_PROVIDER_SITE_OTHER): Payer: Medicare Other

## 2012-01-14 DIAGNOSIS — E785 Hyperlipidemia, unspecified: Secondary | ICD-10-CM | POA: Diagnosis not present

## 2012-01-14 DIAGNOSIS — M949 Disorder of cartilage, unspecified: Secondary | ICD-10-CM | POA: Diagnosis not present

## 2012-01-14 DIAGNOSIS — M8580 Other specified disorders of bone density and structure, unspecified site: Secondary | ICD-10-CM

## 2012-01-14 LAB — LIPID PANEL
Cholesterol: 148 mg/dL (ref 0–200)
LDL Cholesterol: 67 mg/dL (ref 0–99)
VLDL: 23.4 mg/dL (ref 0.0–40.0)

## 2012-01-14 LAB — COMPREHENSIVE METABOLIC PANEL
ALT: 23 U/L (ref 0–35)
Albumin: 4.1 g/dL (ref 3.5–5.2)
Alkaline Phosphatase: 70 U/L (ref 39–117)
CO2: 30 mEq/L (ref 19–32)
Glucose, Bld: 99 mg/dL (ref 70–99)
Potassium: 4.7 mEq/L (ref 3.5–5.1)
Sodium: 141 mEq/L (ref 135–145)
Total Protein: 7.3 g/dL (ref 6.0–8.3)

## 2012-01-15 LAB — VITAMIN D 25 HYDROXY (VIT D DEFICIENCY, FRACTURES): Vit D, 25-Hydroxy: 55 ng/mL (ref 30–89)

## 2012-01-21 ENCOUNTER — Encounter: Payer: Self-pay | Admitting: Family Medicine

## 2012-01-21 ENCOUNTER — Other Ambulatory Visit: Payer: Self-pay | Admitting: *Deleted

## 2012-01-21 ENCOUNTER — Ambulatory Visit (INDEPENDENT_AMBULATORY_CARE_PROVIDER_SITE_OTHER): Payer: Medicare Other | Admitting: Family Medicine

## 2012-01-21 VITALS — BP 120/78 | HR 79 | Temp 97.8°F | Ht 66.5 in | Wt 158.8 lb

## 2012-01-21 DIAGNOSIS — Z0000 Encounter for general adult medical examination without abnormal findings: Secondary | ICD-10-CM | POA: Diagnosis not present

## 2012-01-21 DIAGNOSIS — E785 Hyperlipidemia, unspecified: Secondary | ICD-10-CM

## 2012-01-21 DIAGNOSIS — M949 Disorder of cartilage, unspecified: Secondary | ICD-10-CM

## 2012-01-21 DIAGNOSIS — R7309 Other abnormal glucose: Secondary | ICD-10-CM | POA: Diagnosis not present

## 2012-01-21 DIAGNOSIS — M899 Disorder of bone, unspecified: Secondary | ICD-10-CM | POA: Diagnosis not present

## 2012-01-21 DIAGNOSIS — R1013 Epigastric pain: Secondary | ICD-10-CM

## 2012-01-21 DIAGNOSIS — M8580 Other specified disorders of bone density and structure, unspecified site: Secondary | ICD-10-CM

## 2012-01-21 MED ORDER — SIMVASTATIN 20 MG PO TABS: 20.0000 mg | ORAL_TABLET | Freq: Every day | ORAL | Status: DC

## 2012-01-21 NOTE — Progress Notes (Deleted)
  Subjective:    Patient ID: Natasha Mendoza, female    DOB: 01/27/1946, 66 y.o.   MRN: [id number]  HPI    Review of Systems     Objective:   Physical Exam        Assessment & Plan:

## 2012-01-21 NOTE — Assessment & Plan Note (Signed)
Counseled pt to restart zegrid for hiatal hernia and occult reflux as leaning forward may be causing increase abdominal pressure to worsen symptoms. Labs reviewed in detail. Call if not improving with this medication.

## 2012-01-21 NOTE — Progress Notes (Signed)
HPI  I have personally reviewed the Medicare Annual Wellness questionnaire and have noted  1. The patient's medical and social history  2. Their use of alcohol, tobacco or illicit drugs  3. Their current medications and supplements  4. The patient's functional ability including ADL's, fall risks, home safety risks and hearing or visual  impairment.  5. Diet and physical activities  6. Evidence for depression or mood disorders  The patients weight, height, BMI and visual acuity have been recorded in the chart  I have made referrals, counseling and provided education to the patient based review of the above and I have provided the pt with a written personalized care plan for preventive services.    She reports a recurrence of upper abdominal pain since 12/2010..last 30 minutes to 5 minutes at a time, occuring few times a day. Saw  Myself, Dr. Patsy Lager and Dr. Juanda Chance last year for abdominal pain.. Labs including CMET, cbc, lipase and Hpylori were negative. Abd Pelvic CT was normal.  She denies dysphagia or odynophagia. She denies using anti-inflammatory agents. The pain resembles her prior gallbladder attacks. She had a laparoscopic cholecystectomy by Dr Orpah Greek about 20 years ago. She had an ERCP by Dr. Jarold Motto in 1988 which was unremarkable.    Dr. Juanda Chance performed endoscopy in 2012.Had hiatal hernia and stricture was dilatedand started on zegris.[place] Pages this for a while, symptoms resolve but stopped taking because she felt better.  Notes more if bending over. No relationship to food, eating. Occ wakes her up in middle of night. She feels like she can relax it if she sits up and takes deep breaths.  Elevated Cholesterol: Well controlled on simvastatin 20 mg daily    Lab Results  Component Value Date   CHOL 148 01/14/2012   HDL 57.50 01/14/2012   LDLCALC 67 01/14/2012   TRIG 117.0 01/14/2012   CHOLHDL 3 01/14/2012   Using medications without problems: yes  Muscle aches: None   Exercsie: 5 days a  week, elliptical  Diet: healthy  Prediabetes,resolved  Menopausal syndrome: Trial of SSRI. Diud not help. Hot flashes and symptoms are tolerable. .  Review of Systems  Constitutional: Negative for fever and fatigue.  HENT: Negative for ear pain.  Eyes: Negative for pain.  Respiratory: Negative for chest tightness and shortness of breath.  Cardiovascular: Negative for chest pain, palpitations and leg swelling.  Gastrointestinal: Positive for constipation. Negative for abdominal pain.  Genitourinary: Negative for dysuria, hematuria, vaginal bleeding, vaginal discharge, vaginal pain and pelvic pain.  Neurological: Negative for syncope.    Objective:   Physical Exam  Constitutional: Vital signs are normal. She appears well-developed and well-nourished. She is cooperative. Non-toxic appearance. She does not appear ill. No distress.  HENT:  Head: Normocephalic.  Right Ear: Hearing, tympanic membrane, external ear and ear canal normal.  Left Ear: Hearing, tympanic membrane, external ear and ear canal normal.  Nose: Nose normal.  Eyes: Conjunctivae, EOM and lids are normal. Pupils are equal, round, and reactive to light. No foreign bodies found.  Neck: Trachea normal and normal range of motion. Neck supple. Carotid bruit is not present. No mass and no thyromegaly present.  Cardiovascular: Normal rate, regular rhythm, S1 normal, S2 normal, normal heart sounds and intact distal pulses. Exam reveals no gallop.  No murmur heard.  Pulmonary/Chest: Effort normal and breath sounds normal. No respiratory distress. She has no wheezes. She has no rhonchi. She has no rales.  Abdominal: Soft. Normal appearance and bowel sounds are normal.  She exhibits no distension, no fluid wave, no abdominal bruit and no mass. There is no hepatosplenomegaly. There is no tenderness. There is no rebound, no guarding and no CVA tenderness. No hernia.  Genitourinary:  NO DVE. No pap performed  Lymphadenopathy:  She has  no cervical adenopathy.  She has no axillary adenopathy.  Neurological: She is alert. She has normal strength. No cranial nerve deficit or sensory deficit.  Skin: Skin is warm, dry and intact. No rash noted.  Psychiatric: Her speech is normal and behavior is normal. Judgment normal. Her mood appears not anxious. Cognition and memory are normal. She does not exhibit a depressed mood.    Assessment & Plan:   Annual Medicare Wellness: The patient's preventative maintenance and recommended screening tests for an annual wellness exam were reviewed in full today.  Brought up to date unless services declined.  Counselled on the importance of diet, exercise, and its role in overall health and mortality.  The patient's FH and SH was reviewed, including their home life, tobacco status, and drug and alcohol status.   Last DEXA 12/2010 new disgnosis osteopenia Last Colon 2007 normal, Dr. Juanda Chance Refuses any vaccines.  12/2010 mammogram nml. Will schedule  DVE/PAP:  No pap indicated after age 28, DVEevery other year (not this year) No family uterine or ovarian cancer.

## 2012-01-21 NOTE — Assessment & Plan Note (Signed)
Well controlled. Continue current medication. Encouraged exercise, weight loss, healthy eating habits.  

## 2012-01-21 NOTE — Assessment & Plan Note (Signed)
Resolved

## 2012-01-21 NOTE — Patient Instructions (Addendum)
Schedule mammogram. Restart Zegrid.. Trial for 2 weeks. If symptoms not improving call or return to Dr. Juanda Chance for further evaluation. If helps call for refill to continue minimum of 4-6 weeks.

## 2012-03-13 ENCOUNTER — Telehealth: Payer: Self-pay | Admitting: *Deleted

## 2012-03-13 NOTE — Telephone Encounter (Signed)
Patient called requesting a itemized statement for DOS 01-21-12, she feels that she has been over billed. I asked our billing PC billing to please review, they feel that the visit was not billed appropriately and they have resubmitted the claim. I call patient back and explained that we are rebilling the claim and why. She questioned why she was billed a wellness visit and an office visit charge. I explained that the billing was appropriate because Dr. Ermalene Searing addressed a focused problem during the visit.

## 2012-05-01 ENCOUNTER — Other Ambulatory Visit: Payer: Self-pay

## 2012-05-01 MED ORDER — SIMVASTATIN 20 MG PO TABS: 20.0000 mg | ORAL_TABLET | Freq: Every day | ORAL | Status: DC

## 2012-05-01 NOTE — Telephone Encounter (Signed)
Pt request simvastatin # 90 due to insurance guidelines sent to [facility] as requested. Pt notified.

## 2013-01-15 ENCOUNTER — Telehealth: Payer: Self-pay | Admitting: Family Medicine

## 2013-01-15 DIAGNOSIS — R7309 Other abnormal glucose: Secondary | ICD-10-CM

## 2013-01-15 DIAGNOSIS — E785 Hyperlipidemia, unspecified: Secondary | ICD-10-CM

## 2013-01-15 DIAGNOSIS — M8580 Other specified disorders of bone density and structure, unspecified site: Secondary | ICD-10-CM

## 2013-01-15 NOTE — Telephone Encounter (Signed)
Message copied by Excell Seltzer on Fri Jan 15, 2013 10:38 AM ------      Message from: Alvina Chou      Created: Wed Jan 13, 2013 12:24 PM      Regarding: Lab orders for Monday, 8.11.14       Patient is scheduled for CPX labs, please order future labs, Thanks , Terri       ------

## 2013-01-18 ENCOUNTER — Other Ambulatory Visit (INDEPENDENT_AMBULATORY_CARE_PROVIDER_SITE_OTHER): Payer: Medicare Other

## 2013-01-18 DIAGNOSIS — M949 Disorder of cartilage, unspecified: Secondary | ICD-10-CM | POA: Diagnosis not present

## 2013-01-18 DIAGNOSIS — E785 Hyperlipidemia, unspecified: Secondary | ICD-10-CM | POA: Diagnosis not present

## 2013-01-18 DIAGNOSIS — R7309 Other abnormal glucose: Secondary | ICD-10-CM

## 2013-01-18 DIAGNOSIS — M8580 Other specified disorders of bone density and structure, unspecified site: Secondary | ICD-10-CM

## 2013-01-18 LAB — COMPREHENSIVE METABOLIC PANEL
ALT: 22 U/L (ref 0–35)
AST: 11 U/L (ref 0–37)
Albumin: 4.2 g/dL (ref 3.5–5.2)
Calcium: 9.3 mg/dL (ref 8.4–10.5)
Chloride: 105 mEq/L (ref 96–112)
Potassium: 4.6 mEq/L (ref 3.5–5.1)
Total Protein: 7.9 g/dL (ref 6.0–8.3)

## 2013-01-18 LAB — LIPID PANEL
LDL Cholesterol: 71 mg/dL (ref 0–99)
Total CHOL/HDL Ratio: 3

## 2013-01-21 ENCOUNTER — Encounter: Payer: Self-pay | Admitting: Family Medicine

## 2013-01-21 ENCOUNTER — Ambulatory Visit (INDEPENDENT_AMBULATORY_CARE_PROVIDER_SITE_OTHER): Payer: Medicare Other | Admitting: Family Medicine

## 2013-01-21 VITALS — BP 130/70 | HR 64 | Temp 97.5°F | Wt 160.0 lb

## 2013-01-21 DIAGNOSIS — M8580 Other specified disorders of bone density and structure, unspecified site: Secondary | ICD-10-CM

## 2013-01-21 DIAGNOSIS — Z0000 Encounter for general adult medical examination without abnormal findings: Secondary | ICD-10-CM

## 2013-01-21 DIAGNOSIS — R7309 Other abnormal glucose: Secondary | ICD-10-CM

## 2013-01-21 DIAGNOSIS — E785 Hyperlipidemia, unspecified: Secondary | ICD-10-CM

## 2013-01-21 NOTE — Patient Instructions (Addendum)
Schedule mammogram on your own.  Continue working on healthy eating,and regular exercsie.

## 2013-01-21 NOTE — Progress Notes (Signed)
HPI  I have personally reviewed the Medicare Annual Wellness questionnaire and have noted  1. The patient's medical and social history  2. Their use of alcohol, tobacco or illicit drugs  3. Their current medications and supplements  4. The patient's functional ability including ADL's, fall risks, home safety risks and hearing or visual  impairment.  5. Diet and physical activities  6. Evidence for depression or mood disorders  The patients weight, height, BMI and visual acuity have been recorded in the chart  I have made referrals, counseling and provided education to the patient based review of the above and I have provided the pt with a written personalized care plan for preventive services.    Elevated Cholesterol: Well controlled on simvastatin 20 mg daily  Lab Results  Component Value Date   CHOL 147 01/18/2013   HDL 50.20 01/18/2013   LDLCALC 71 01/18/2013   TRIG 128.0 01/18/2013   CHOLHDL 3 01/18/2013   Using medications without problems: yes  Muscle aches: None  Exercsie: 5 days a week, elliptical  Diet: healthy  Prediabetes,resolved   Menopausal syndrome: Tolerable hot flashes and symptoms are tolerable.  .  Review of Systems  Constitutional: Negative for fever and fatigue.  HENT: Negative for ear pain.  Eyes: Negative for pain.  Respiratory: Negative for chest tightness and shortness of breath.  Cardiovascular: Negative for chest pain, palpitations and leg swelling.  Gastrointestinal: Positive for constipation. Negative for abdominal pain.  Genitourinary: Negative for dysuria, hematuria, vaginal bleeding, vaginal discharge, vaginal pain and pelvic pain.  Neurological: Negative for syncope.  Objective:   Physical Exam  Constitutional: Vital signs are normal. She appears well-developed and well-nourished. She is cooperative. Non-toxic appearance. She does not appear ill. No distress.  HENT:  Head: Normocephalic.  Right Ear: Hearing, tympanic membrane, external ear and  ear canal normal.  Left Ear: Hearing, tympanic membrane, external ear and ear canal normal.  Nose: Nose normal.  Eyes: Conjunctivae, EOM and lids are normal. Pupils are equal, round, and reactive to light. No foreign bodies found.  Neck: Trachea normal and normal range of motion. Neck supple. Carotid bruit is not present. No mass and no thyromegaly present.  Cardiovascular: Normal rate, regular rhythm, S1 normal, S2 normal, normal heart sounds and intact distal pulses. Exam reveals no gallop.  No murmur heard.  Pulmonary/Chest: Effort normal and breath sounds normal. No respiratory distress. She has no wheezes. She has no rhonchi. She has no rales.  Abdominal: Soft. Normal appearance and bowel sounds are normal. She exhibits no distension, no fluid wave, no abdominal bruit and no mass. There is no hepatosplenomegaly. There is no tenderness. There is no rebound, no guarding and no CVA tenderness. No hernia.  Genitourinary: NO DVE. No pap performed   breast: B no masses, no nipple changes. Lymphadenopathy:  She has no cervical adenopathy.  She has no axillary adenopathy.  Neurological: She is alert. She has normal strength. No cranial nerve deficit or sensory deficit.  Skin: Skin is warm, dry and intact. No rash noted.  Psychiatric: Her speech is normal and behavior is normal. Judgment normal. Her mood appears not anxious. Cognition and memory are normal. She does not exhibit a depressed mood.  Assessment & Plan:   Annual Medicare Wellness: The patient's preventative maintenance and recommended screening tests for an annual wellness exam were reviewed in full today.  Brought up to date unless services declined.  Counselled on the importance of diet, exercise, and its role in overall health  and mortality.  The patient's FH and SH was reviewed, including their home life, tobacco status, and drug and alcohol status.   Last DEXA 12/2010 new disgnosis osteopenia,  Scheduled next week. Last Colon 2007  normal, Dr. Juanda Chance, 10 year repeat Refuses any vaccines including PNA, shingles and tetanus 12/2010 mammogram nml. Overdue. Scheuled on her own. DVE/PAP: No pap indicated after age 4, DVE refused to continue. No family uterine or ovarian cancer.

## 2013-01-27 ENCOUNTER — Other Ambulatory Visit: Payer: Self-pay | Admitting: Family Medicine

## 2013-01-28 DIAGNOSIS — Z1231 Encounter for screening mammogram for malignant neoplasm of breast: Secondary | ICD-10-CM | POA: Diagnosis not present

## 2013-01-28 DIAGNOSIS — M899 Disorder of bone, unspecified: Secondary | ICD-10-CM | POA: Diagnosis not present

## 2013-02-01 ENCOUNTER — Encounter: Payer: Self-pay | Admitting: Family Medicine

## 2013-02-02 ENCOUNTER — Encounter: Payer: Self-pay | Admitting: Family Medicine

## 2013-02-03 ENCOUNTER — Encounter: Payer: Self-pay | Admitting: *Deleted

## 2013-04-15 ENCOUNTER — Other Ambulatory Visit: Payer: Self-pay

## 2013-06-19 IMAGING — CT CT ABD-PELV W/ CM
2 of 5 series · 17 of 46 positions shown, 19 images · IV contrast (Omnipaque 300)
Comparison: None.

CLINICAL DATA: Right upper quadrant pain, cholecystectomy

CT ABDOMEN AND PELVIS WITH CONTRAST
TECHNIQUE: Multidetector CT imaging of the abdomen and pelvis was
performed following the standard protocol during bolus
administration of intravenous contrast.
Contrast:  100 ml Omnipaque 300

[Series 2: abd/ pel 5mm · axial · 0.72mm/px · z∈[-487,-47]mm · 14 of 98 slices shown, 16 images]
[im 5/98  soft-tissue]
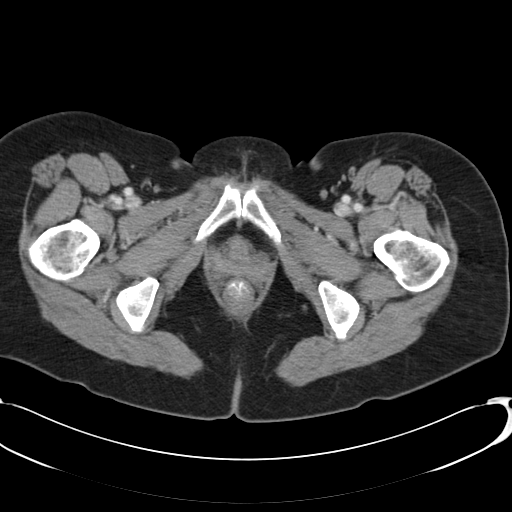
[im 5/98  bone]
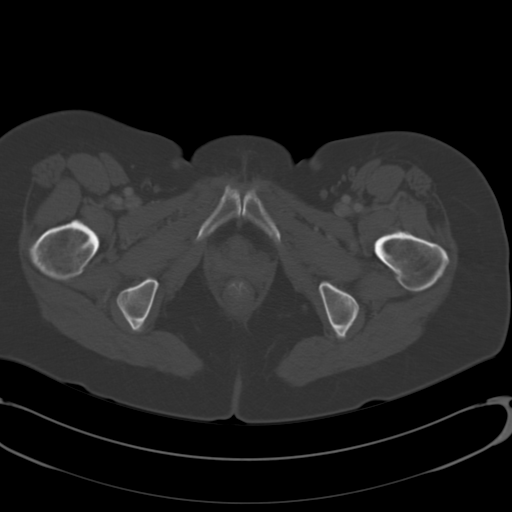
[im 14/98  soft-tissue]
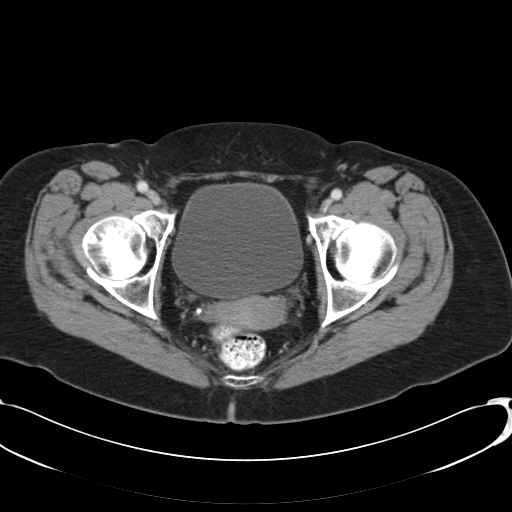
[im 19/98  soft-tissue]
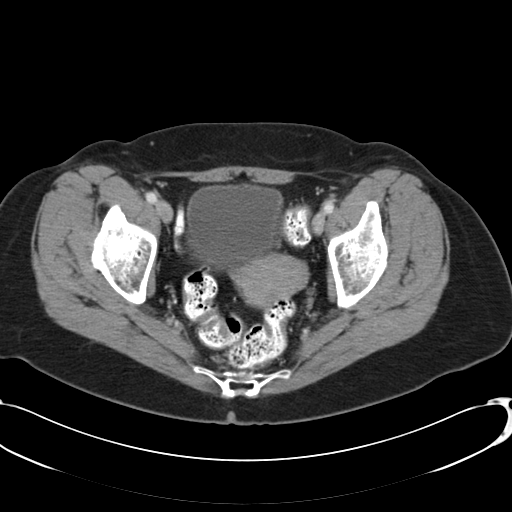
[im 28/98  soft-tissue]
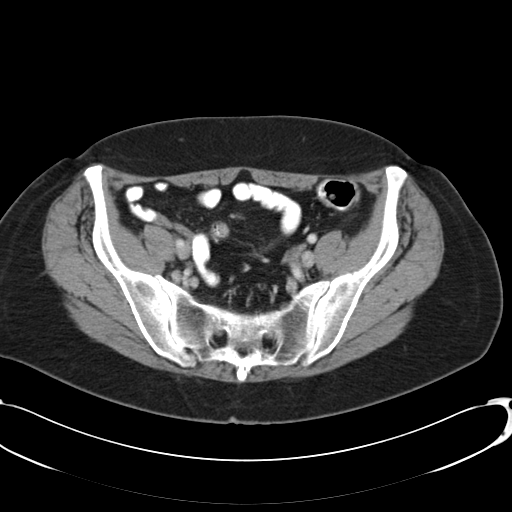
[im 33/98  soft-tissue]
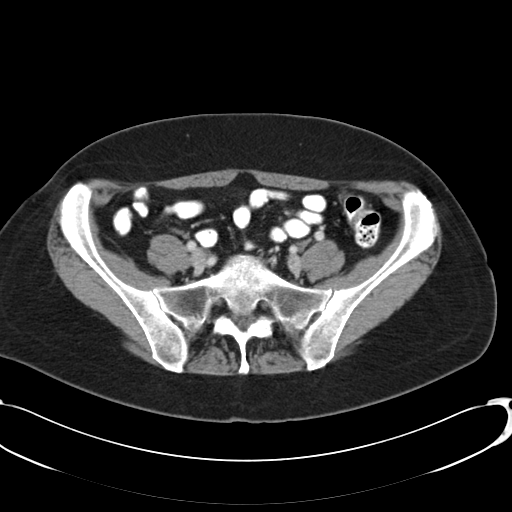
[im 37/98  soft-tissue]
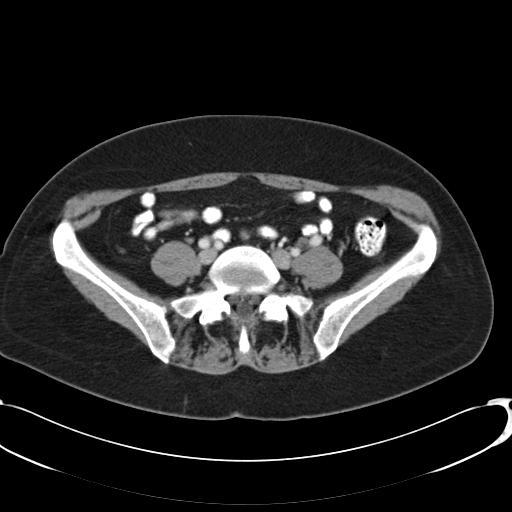
[im 47/98  soft-tissue]
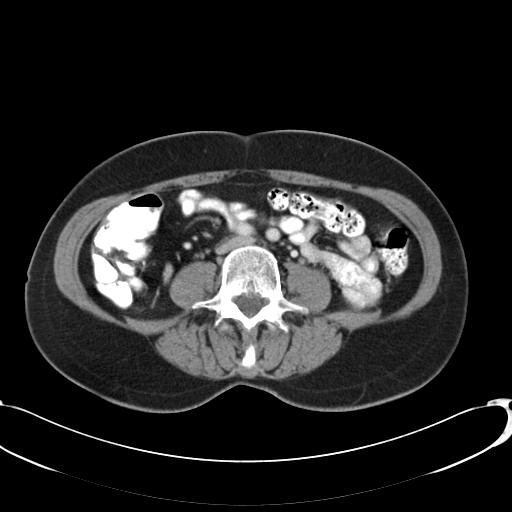
[im 51/98  soft-tissue]
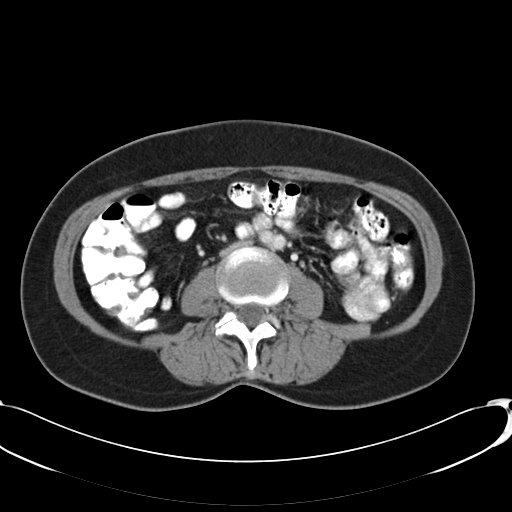
[im 61/98  soft-tissue]
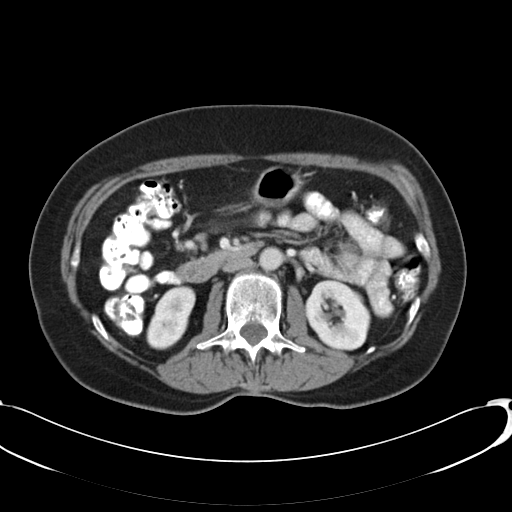
[im 61/98  bone]
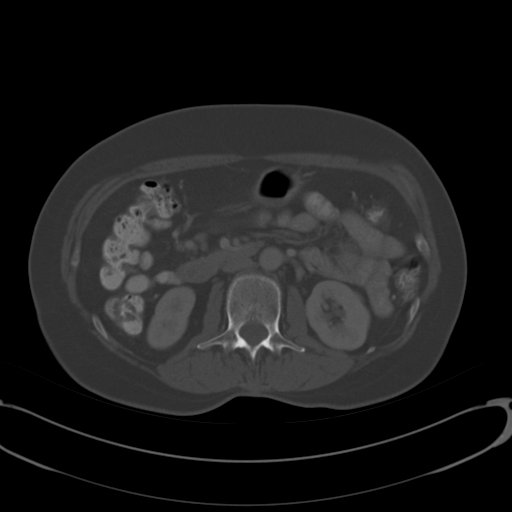
[im 65/98  soft-tissue]
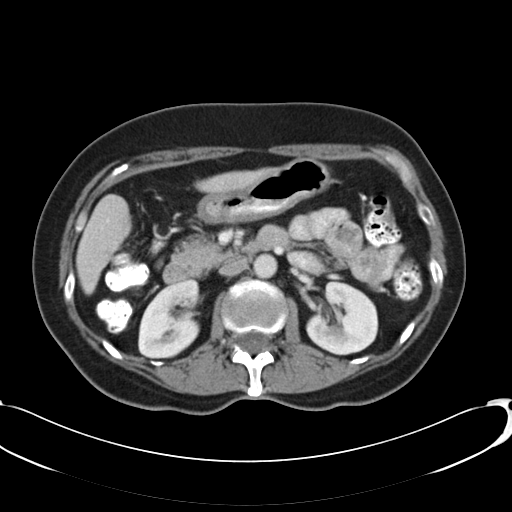
[im 74/98  soft-tissue]
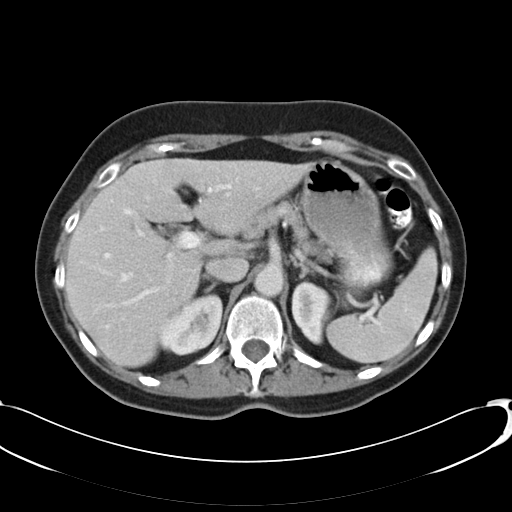
[im 79/98  soft-tissue]
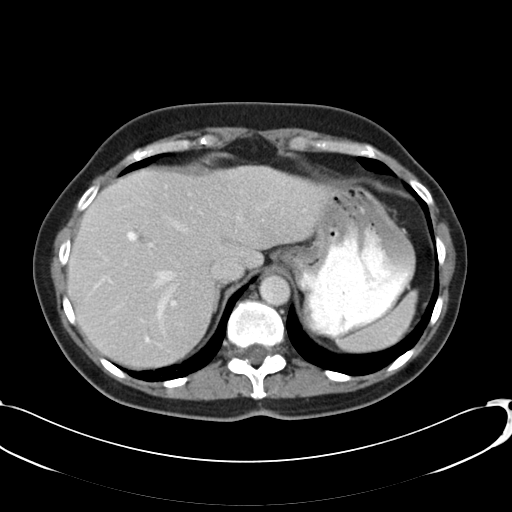
[im 84/98  soft-tissue]
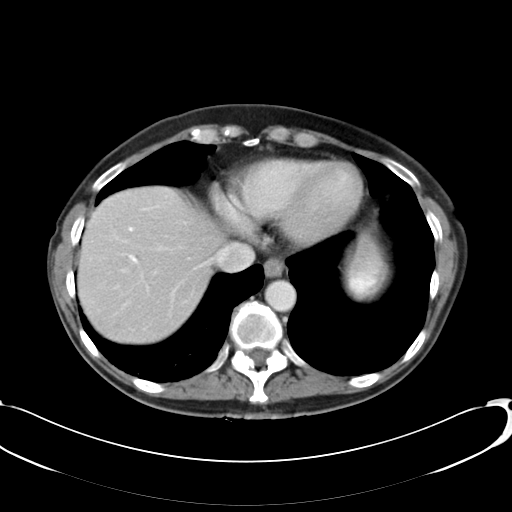
[im 93/98  soft-tissue]
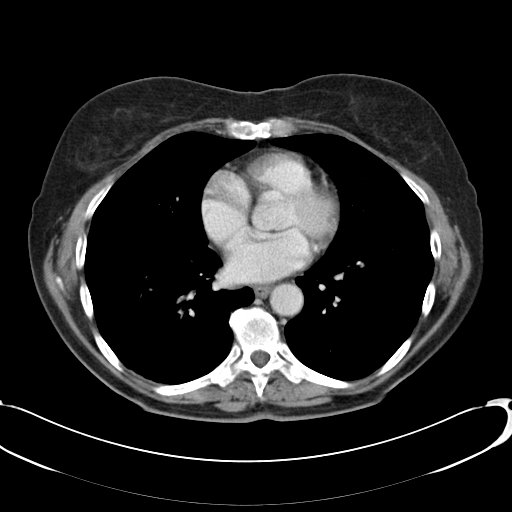

[Series 602: cor · coronal · 0.98mm/px · 3 of 106 slices shown]
[im 36/106  soft-tissue]
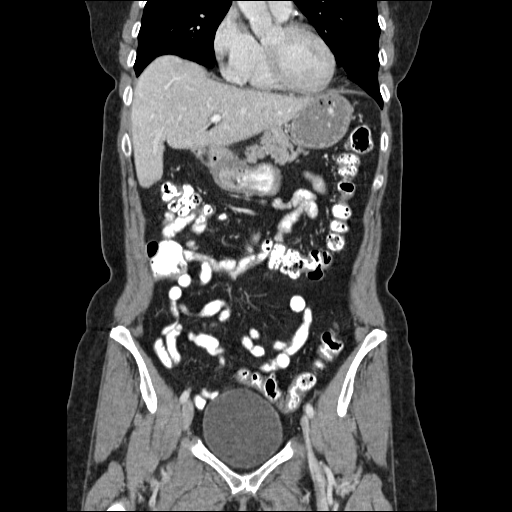
[im 47/106  soft-tissue]
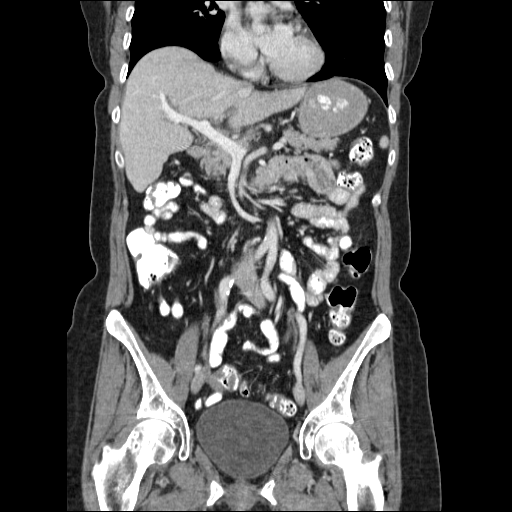
[im 59/106  soft-tissue]
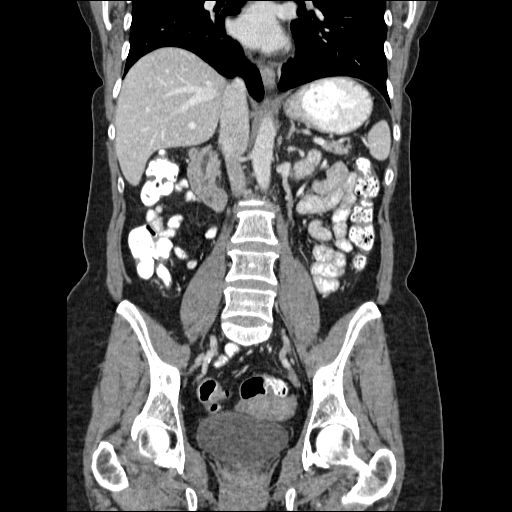

[17 of 46 positions shown; findings below may reference images not displayed]

FINDINGS: Lung bases are clear.  No pericardial fluid.

No focal hepatic lesion.  Post cholecystectomy.  Pancreas is
normal.  The common bile duct is at the upper limits of normal at 5
mm.  The spleen, adrenal glands, and kidneys are normal.

The stomach, small bowel, appendix, cecum are normal.  The colon
and rectosigmoid colon are normal.

Abdominal aorta normal caliber.  No retroperitoneal
lymphadenopathy.

No free fluid the pelvis.  The bladder and uterus are normal.  The
ovaries are normal for age.  Small 11 mm cyst within the right
ovary.  No pelvic lymphadenopathy. Review of  bone windows
demonstrates no aggressive osseous lesions.  A benign-appearing
sclerotic lesion within the right sacrum and and right ilium.
Hemangiomas within the thoracic spine.
IMPRESSION: 1.  No acute abdominal or pelvic findings.
2.  Cholecystectomy

## 2013-09-09 DIAGNOSIS — L821 Other seborrheic keratosis: Secondary | ICD-10-CM | POA: Diagnosis not present

## 2013-09-09 DIAGNOSIS — L570 Actinic keratosis: Secondary | ICD-10-CM | POA: Diagnosis not present

## 2014-02-01 ENCOUNTER — Other Ambulatory Visit: Payer: Self-pay | Admitting: Family Medicine

## 2014-02-11 DIAGNOSIS — Z1231 Encounter for screening mammogram for malignant neoplasm of breast: Secondary | ICD-10-CM | POA: Diagnosis not present

## 2014-02-15 DIAGNOSIS — H2510 Age-related nuclear cataract, unspecified eye: Secondary | ICD-10-CM | POA: Diagnosis not present

## 2014-02-16 ENCOUNTER — Encounter: Payer: Self-pay | Admitting: Family Medicine

## 2014-03-09 ENCOUNTER — Telehealth: Payer: Self-pay | Admitting: Family Medicine

## 2014-03-09 DIAGNOSIS — R7303 Prediabetes: Secondary | ICD-10-CM

## 2014-03-09 DIAGNOSIS — E785 Hyperlipidemia, unspecified: Secondary | ICD-10-CM

## 2014-03-09 DIAGNOSIS — M8580 Other specified disorders of bone density and structure, unspecified site: Secondary | ICD-10-CM

## 2014-03-09 NOTE — Telephone Encounter (Signed)
Message copied by Jinny Sanders on Wed Mar 09, 2014 11:14 PM ------      Message from: Ellamae Sia      Created: Thu Mar 03, 2014 12:48 PM      Regarding: Lab orders for [facility], 10.1.15       Patient is scheduled for CPX labs, please order future labs, Thanks , Terri       ------

## 2014-03-10 ENCOUNTER — Other Ambulatory Visit (INDEPENDENT_AMBULATORY_CARE_PROVIDER_SITE_OTHER): Payer: Medicare Other

## 2014-03-10 DIAGNOSIS — R7309 Other abnormal glucose: Secondary | ICD-10-CM

## 2014-03-10 DIAGNOSIS — M8580 Other specified disorders of bone density and structure, unspecified site: Secondary | ICD-10-CM | POA: Diagnosis not present

## 2014-03-10 DIAGNOSIS — R7303 Prediabetes: Secondary | ICD-10-CM

## 2014-03-10 DIAGNOSIS — E785 Hyperlipidemia, unspecified: Secondary | ICD-10-CM

## 2014-03-10 LAB — COMPREHENSIVE METABOLIC PANEL
ALBUMIN: 4.1 g/dL (ref 3.5–5.2)
ALT: 24 U/L (ref 0–35)
AST: 9 U/L (ref 0–37)
Alkaline Phosphatase: 70 U/L (ref 39–117)
BUN: 12 mg/dL (ref 6–23)
CALCIUM: 9.1 mg/dL (ref 8.4–10.5)
CHLORIDE: 106 meq/L (ref 96–112)
CO2: 31 meq/L (ref 19–32)
Creatinine, Ser: 0.7 mg/dL (ref 0.4–1.2)
GFR: 86.78 mL/min (ref >60.00)
GLUCOSE: 93 mg/dL (ref 70–99)
Potassium: 3.7 mEq/L (ref 3.5–5.1)
Sodium: 140 mEq/L (ref 135–145)
Total Bilirubin: 0.9 mg/dL (ref 0.2–1.2)
Total Protein: 7.8 g/dL (ref 6.0–8.3)

## 2014-03-10 LAB — LIPID PANEL
CHOLESTEROL: 164 mg/dL (ref 0–200)
HDL: 47.5 mg/dL (ref >39.00)
LDL CALC: 89 mg/dL (ref 0–99)
NonHDL: 116.5
Total CHOL/HDL Ratio: 3
Triglycerides: 140 mg/dL (ref 0.0–149.0)
VLDL: 28 mg/dL (ref 0.0–40.0)

## 2014-03-10 LAB — VITAMIN D 25 HYDROXY (VIT D DEFICIENCY, FRACTURES): VITD: 54.98 ng/mL (ref 30.00–100.00)

## 2014-03-10 LAB — HEMOGLOBIN A1C: HEMOGLOBIN A1C: 5.9 % (ref 4.6–6.5)

## 2014-03-14 DIAGNOSIS — L570 Actinic keratosis: Secondary | ICD-10-CM | POA: Diagnosis not present

## 2014-03-14 DIAGNOSIS — D485 Neoplasm of uncertain behavior of skin: Secondary | ICD-10-CM | POA: Diagnosis not present

## 2014-03-14 DIAGNOSIS — L821 Other seborrheic keratosis: Secondary | ICD-10-CM | POA: Diagnosis not present

## 2014-03-17 ENCOUNTER — Encounter: Payer: Self-pay | Admitting: Family Medicine

## 2014-03-17 ENCOUNTER — Ambulatory Visit (INDEPENDENT_AMBULATORY_CARE_PROVIDER_SITE_OTHER): Payer: Medicare Other | Admitting: Family Medicine

## 2014-03-17 VITALS — BP 136/82 | HR 74 | Temp 98.2°F | Ht 67.0 in | Wt 163.2 lb

## 2014-03-17 DIAGNOSIS — Z0000 Encounter for general adult medical examination without abnormal findings: Secondary | ICD-10-CM | POA: Diagnosis not present

## 2014-03-17 DIAGNOSIS — E785 Hyperlipidemia, unspecified: Secondary | ICD-10-CM | POA: Diagnosis not present

## 2014-03-17 NOTE — Assessment & Plan Note (Signed)
Well controlled. Continue current medication.  

## 2014-03-17 NOTE — Patient Instructions (Signed)
Keep working on healthy eating and regular exercise! 

## 2014-03-17 NOTE — Progress Notes (Signed)
HPI  I have personally reviewed the Medicare Annual Wellness questionnaire and have noted  1. The patient's medical and social history  2. Their use of alcohol, tobacco or illicit drugs  3. Their current medications and supplements  4. The patient's functional ability including ADL's, fall risks, home safety risks and hearing or visual  impairment.  5. Diet and physical activities  6. Evidence for depression or mood disorders  The patients weight, height, BMI and visual acuity have been recorded in the chart  I have made referrals, counseling and provided education to the patient based review of the above and I have provided the pt with a written personalized care plan for preventive services.   Feeling well overall, no specific concerns.  Elevated Cholesterol: Well controlled on simvastatin 20 mg daily  Lab Results  Component Value Date   CHOL 164 03/10/2014   HDL 47.50 03/10/2014   LDLCALC 89 03/10/2014   TRIG 140.0 03/10/2014   CHOLHDL 3 03/10/2014  Using medications without problems: yes  Muscle aches: None  Exercsie: 5 days a week, elliptical  Diet: healthy, some trouble with carb   Prediabetes,resolved  Lab Results  Component Value Date   HGBA1C 5.9 03/10/2014     BP Readings from Last 3 Encounters:  03/17/14 136/82  01/21/13 130/70  01/21/12 120/78     Menopausal syndrome: Trial of SSRI. Diud not help. Hot flashes and symptoms are tolerable.  .  Review of Systems  Constitutional: Negative for fever and fatigue.  HENT: Negative for ear pain.  Eyes: Negative for pain.  Respiratory: Negative for chest tightness and shortness of breath.  Cardiovascular: Negative for chest pain, palpitations and leg swelling.  Gastrointestinal: Positive for constipation, chronic. Negative for abdominal pain.  Genitourinary: Negative for dysuria, hematuria, vaginal bleeding, vaginal discharge, vaginal pain and pelvic pain.  Neurological: Negative for syncope.  Objective:   Physical  Exam  Constitutional: Vital signs are normal. She appears well-developed and well-nourished. She is cooperative. Non-toxic appearance. She does not appear ill. No distress.  HENT:  Head: Normocephalic.  Right Ear: Hearing, tympanic membrane, external ear and ear canal normal.  Left Ear: Hearing, tympanic membrane, external ear and ear canal normal.  Nose: Nose normal.  Eyes: Conjunctivae, EOM and lids are normal. Pupils are equal, round, and reactive to light. No foreign bodies found.  Neck: Trachea normal and normal range of motion. Neck supple. Carotid bruit is not present. No mass and no thyromegaly present.  Cardiovascular: Normal rate, regular rhythm, S1 normal, S2 normal, normal heart sounds and intact distal pulses. Exam reveals no gallop.  No murmur heard.  Pulmonary/Chest: Effort normal and breath sounds normal. No respiratory distress. She has no wheezes. She has no rhonchi. She has no rales.  Abdominal: Soft. Normal appearance and bowel sounds are normal. She exhibits no distension, no fluid wave, no abdominal bruit and no mass. There is no hepatosplenomegaly. There is no tenderness. There is no rebound, no guarding and no CVA tenderness. No hernia.  Genitourinary: NO DVE. No pap performed  Lymphadenopathy:  She has no cervical adenopathy.  She has no axillary adenopathy.  Neurological: She is alert. She has normal strength. No cranial nerve deficit or sensory deficit.  Skin: Skin is warm, dry and intact. No rash noted.  Psychiatric: Her speech is normal and behavior is normal. Judgment normal. Her mood appears not anxious. Cognition and memory are normal. She does not exhibit a depressed mood.  Assessment & Plan:   Annual Medicare  Wellness: The patient's preventative maintenance and recommended screening tests for an annual wellness exam were reviewed in full today.  Brought up to date unless services declined.  Counselled on the importance of diet, exercise, and its role in  overall health and mortality.  The patient's FH and SH was reviewed, including their home life, tobacco status, and drug and alcohol status.   Last DEXA 01/2013 new disgnosis osteopenia 2012,stable on repeat, plan repeat  in 5 years. Last Colon 2007 normal, Dr. Olevia Perches, 10 year repeat  Refuses any vaccines including PNA, shingles and tetanus  02/2014 mammogram nml.  DVE/PAP: No pap indicated after age 89, DVE refused to continue. No family uterine or ovarian cancer.

## 2014-04-01 DIAGNOSIS — H2510 Age-related nuclear cataract, unspecified eye: Secondary | ICD-10-CM | POA: Diagnosis not present

## 2014-05-03 ENCOUNTER — Other Ambulatory Visit: Payer: Self-pay | Admitting: Family Medicine

## 2014-06-17 ENCOUNTER — Encounter: Payer: Self-pay | Admitting: Family Medicine

## 2014-08-22 DIAGNOSIS — N10 Acute tubulo-interstitial nephritis: Secondary | ICD-10-CM | POA: Diagnosis not present

## 2014-12-05 ENCOUNTER — Other Ambulatory Visit: Payer: Self-pay

## 2014-12-12 DIAGNOSIS — J209 Acute bronchitis, unspecified: Secondary | ICD-10-CM | POA: Diagnosis not present

## 2015-01-24 DIAGNOSIS — H2513 Age-related nuclear cataract, bilateral: Secondary | ICD-10-CM | POA: Diagnosis not present

## 2015-03-07 DIAGNOSIS — H25011 Cortical age-related cataract, right eye: Secondary | ICD-10-CM | POA: Diagnosis not present

## 2015-03-07 DIAGNOSIS — H18411 Arcus senilis, right eye: Secondary | ICD-10-CM | POA: Diagnosis not present

## 2015-03-07 DIAGNOSIS — H2511 Age-related nuclear cataract, right eye: Secondary | ICD-10-CM | POA: Diagnosis not present

## 2015-03-07 DIAGNOSIS — H2512 Age-related nuclear cataract, left eye: Secondary | ICD-10-CM | POA: Diagnosis not present

## 2015-03-10 ENCOUNTER — Encounter: Payer: Self-pay | Admitting: Family Medicine

## 2015-03-13 ENCOUNTER — Other Ambulatory Visit: Payer: Self-pay | Admitting: Family Medicine

## 2015-03-13 DIAGNOSIS — M8580 Other specified disorders of bone density and structure, unspecified site: Secondary | ICD-10-CM

## 2015-03-13 DIAGNOSIS — E663 Overweight: Secondary | ICD-10-CM

## 2015-03-13 DIAGNOSIS — E785 Hyperlipidemia, unspecified: Secondary | ICD-10-CM

## 2015-03-15 ENCOUNTER — Other Ambulatory Visit (INDEPENDENT_AMBULATORY_CARE_PROVIDER_SITE_OTHER): Payer: Medicare Other

## 2015-03-15 DIAGNOSIS — M8580 Other specified disorders of bone density and structure, unspecified site: Secondary | ICD-10-CM | POA: Diagnosis not present

## 2015-03-15 DIAGNOSIS — E785 Hyperlipidemia, unspecified: Secondary | ICD-10-CM

## 2015-03-15 DIAGNOSIS — E663 Overweight: Secondary | ICD-10-CM | POA: Diagnosis not present

## 2015-03-15 LAB — LIPID PANEL
CHOL/HDL RATIO: 3
Cholesterol: 162 mg/dL (ref 0–200)
HDL: 57.7 mg/dL (ref >39.00)
LDL CALC: 85 mg/dL (ref 0–99)
NONHDL: 104.72
TRIGLYCERIDES: 100 mg/dL (ref 0.0–149.0)
VLDL: 20 mg/dL (ref 0.0–40.0)

## 2015-03-15 LAB — COMPREHENSIVE METABOLIC PANEL
ALT: 28 U/L (ref 0–35)
AST: 12 U/L (ref 0–37)
Albumin: 4.2 g/dL (ref 3.5–5.2)
Alkaline Phosphatase: 80 U/L (ref 39–117)
BUN: 15 mg/dL (ref 6–23)
CALCIUM: 9.5 mg/dL (ref 8.4–10.5)
CHLORIDE: 104 meq/L (ref 96–112)
CO2: 31 meq/L (ref 19–32)
Creatinine, Ser: 0.79 mg/dL (ref 0.40–1.20)
GFR: 76.5 mL/min (ref >60.00)
GLUCOSE: 103 mg/dL — AB (ref 70–99)
Potassium: 4.6 mEq/L (ref 3.5–5.1)
Sodium: 140 mEq/L (ref 135–145)
Total Bilirubin: 0.5 mg/dL (ref 0.2–1.2)
Total Protein: 7.5 g/dL (ref 6.0–8.3)

## 2015-03-15 LAB — CBC
HCT: 43.5 % (ref 36.0–46.0)
HEMOGLOBIN: 14.7 g/dL (ref 12.0–15.0)
MCHC: 33.8 g/dL (ref 30.0–36.0)
MCV: 90.6 fl (ref 78.0–100.0)
PLATELETS: 291 10*3/uL (ref 150.0–400.0)
RBC: 4.8 Mil/uL (ref 3.87–5.11)
RDW: 13.3 % (ref 11.5–15.5)
WBC: 6.5 10*3/uL (ref 4.0–10.5)

## 2015-03-20 ENCOUNTER — Encounter: Payer: Self-pay | Admitting: Family Medicine

## 2015-03-20 ENCOUNTER — Ambulatory Visit (INDEPENDENT_AMBULATORY_CARE_PROVIDER_SITE_OTHER): Payer: Medicare Other | Admitting: Family Medicine

## 2015-03-20 VITALS — BP 144/96 | HR 65 | Temp 98.0°F | Ht 67.75 in | Wt 162.4 lb

## 2015-03-20 DIAGNOSIS — IMO0001 Reserved for inherently not codable concepts without codable children: Secondary | ICD-10-CM

## 2015-03-20 DIAGNOSIS — M8580 Other specified disorders of bone density and structure, unspecified site: Secondary | ICD-10-CM | POA: Diagnosis not present

## 2015-03-20 DIAGNOSIS — K5909 Other constipation: Secondary | ICD-10-CM

## 2015-03-20 DIAGNOSIS — E785 Hyperlipidemia, unspecified: Secondary | ICD-10-CM

## 2015-03-20 DIAGNOSIS — Z0000 Encounter for general adult medical examination without abnormal findings: Secondary | ICD-10-CM

## 2015-03-20 DIAGNOSIS — R030 Elevated blood-pressure reading, without diagnosis of hypertension: Secondary | ICD-10-CM

## 2015-03-20 MED ORDER — POLYETHYLENE GLYCOL 3350 17 GM/SCOOP PO POWD: 17.0000 g | Freq: Two times a day (BID) | ORAL | Status: DC | PRN

## 2015-03-20 NOTE — Assessment & Plan Note (Signed)
Bone density scan ordered.

## 2015-03-20 NOTE — Assessment & Plan Note (Signed)
BP elevated today. Recheck in 2 weeks.

## 2015-03-20 NOTE — Assessment & Plan Note (Signed)
Advised daily MiraLAX. Rx given

## 2015-03-20 NOTE — Progress Notes (Signed)
Subjective:    Natasha Mendoza is a 69 y.o. female who presents for Medicare Annual/Subsequent preventive examination.  She has no current complaints and states that she is doing well.   Preventive Screening-Counseling & Management  Tobacco History  Smoking status  . Never Smoker   Smokeless tobacco  . Never Used    Current Problems (verified) Patient Active Problem List   Diagnosis Date Noted  . Elevated BP 03/20/2015  . Osteopenia 01/04/2011  . CONSTIPATION, CHRONIC 08/30/2008  . Hyperlipidemia 05/26/2007   Medications Prior to Visit Current Outpatient Prescriptions on File Prior to Visit  Medication Sig Dispense Refill  . aspirin 81 MG tablet Take 81 mg by mouth daily.    Mariane Baumgarten Sodium (COLACE PO) Take 1 tablet by mouth daily.    . simvastatin (ZOCOR) 20 MG tablet TAKE ONE TABLET BY MOUTH EVERY NIGHT AT BEDTIME 90 tablet 3   No current facility-administered medications on file prior to visit.   Current Medications (verified) Current Outpatient Prescriptions  Medication Sig Dispense Refill  . aspirin 81 MG tablet Take 81 mg by mouth daily.    Mariane Baumgarten Sodium (COLACE PO) Take 1 tablet by mouth daily.    . simvastatin (ZOCOR) 20 MG tablet TAKE ONE TABLET BY MOUTH EVERY NIGHT AT BEDTIME 90 tablet 3  . polyethylene glycol powder (GLYCOLAX/MIRALAX) powder Take 17 g by mouth 2 (two) times daily as needed. 850 g 1   No current facility-administered medications for this visit.    Allergies (verified) Erythromycin   PAST HISTORY  Family History Family History  Problem Relation Age of Onset  . Heart attack Father 83    massive  . Hyperlipidemia Father   . Stroke Mother   . Diabetes Neg Hx   . Colon cancer Neg Hx    Social History Social History  Substance Use Topics  . Smoking status: Never Smoker   . Smokeless tobacco: Never Used  . Alcohol Use: 0.6 oz/week    1 Glasses of wine per week     Comment: occasionally    Are there smokers in your home  (other than you)? No  Risk Factors Current exercise habits: Gym/ health club routine includes cardio and light weights. 5 days a week.  Dietary issues discussed: Well balanced diet.    Cardiac risk factors: advanced age (older than 41 for men, 86 for women), dyslipidemia and family history of premature cardiovascular disease.  Depression Screen (Note: if answer to either of the following is "Yes", a more complete depression screening is indicated)   Over the past two weeks, have you felt down, depressed or hopeless? No  Over the past two weeks, have you felt little interest or pleasure in doing things? No  Have you lost interest or pleasure in daily life? No  Do you often feel hopeless? No  Do you cry easily over simple problems? No  Activities of Daily Living In your present state of health, do you have any difficulty performing the following activities?:  Driving? No Managing money?  No Feeding yourself? No Getting from bed to chair? No.  Climbing a flight of stairs? No Preparing food and eating?: No Bathing or showering? No Getting dressed: No Getting to the toilet? No Using the toilet:No Moving around from place to place: No In the past year have you fallen or had a near fall?:No   Are you sexually active?  Yes  Do you have more than one partner?  No  Hearing Difficulties:  No Do you often ask people to speak up or repeat themselves? No Do you experience ringing or noises in your ears? No Do you have difficulty understanding soft or whispered voices? No   Do you feel that you have a problem with memory? No  Do you often misplace items? No  Do you feel safe at home?  Yes  Cognitive Testing  Alert? Yes  Normal Appearance?Yes  Oriented to person? Yes  Place? Yes   Time? Yes  Recall of three objects?  Yes  Can perform simple calculations? Yes  Displays appropriate judgment?Yes  Can read the correct time from a watch face?Yes   Advanced Directives have been discussed  with the patient? Yes  List the Names of Other Physician/Practitioners you currently use: 1. [facility] - Dermatology 2. Dr. Gloriann Loan - Ophthalmology.   There is no immunization history for the selected administration types on file for this patient.  Screening Tests Health Maintenance  Topic Date Due  . INFLUENZA VACCINE  03/19/2016 (Originally 01/09/2015)  . Hepatitis C Screening  03/19/2016 (Originally 1946-04-28)  . PNA vac Low Risk Adult (1 of 2 - PCV13) 03/19/2016 (Originally 04/25/2010)  . COLONOSCOPY  07/11/2015  . MAMMOGRAM  02/12/2016  . TETANUS/TDAP  12/17/2020  . DEXA SCAN  Completed  . ZOSTAVAX  Addressed   All answers were reviewed with the patient and necessary referrals were made:  Thersa Salt, DO   03/20/2015   History reviewed: allergies, current medications, past family history, past medical history, past social history, past surgical history and problem list  Review of Systems A comprehensive review of systems was negative except for constipation.    Objective:  Vision by Snellen chart: right eye:20/50, left eye:20/50; Patient has upcoming cataract surgery.  Body mass index is 24.87 kg/(m^2). BP 144/96 mmHg  Pulse 65  Temp(Src) 98 F (36.7 C) (Oral)  Ht 5' 7.75" (1.721 m)  Wt 162 lb 6 oz (73.653 kg)  BMI 24.87 kg/m2  SpO2 98%  General: well developed, well nourished, NAD. HEENT: NCAT; Normal TM's bilaterally. Dentures present. Oropharynx clear.  CV: RRR. No murmurs, rubs, gallops. No LE edema.  Respiratory: Clear auscultation bilaterally. No rales rhonchi or wheezing. GI: Abdomen soft, nontender, nondistended. No palpable organomegaly. MSK: Normal range of motion. Psych: Normal mood and affect. Neuro: No focal deficits.  Assessment:     69 year old female with chronic constipation, HLD, Osteopenia. Doing well.       Plan:     During the course of the visit the patient was educated and counseled about appropriate screening and preventive services  including:    Pneumococcal vaccine   Influenza vaccine  Screening mammography  Screening Pap smear and pelvic exam   Bone densitometry screening  Nutrition counseling   Advanced directives: has an advanced directive - a copy HAS NOT been provided.  Diet review for nutrition referral? Yes ____  Not Indicated _X_   Patient Instructions (the written plan) was given to the patient.  Medicare Attestation I have personally reviewed: The patient's medical and social history Their use of alcohol, tobacco or illicit drugs Their current medications and supplements The patient's functional ability including ADLs,fall risks, home safety risks, cognitive, and hearing and visual impairment Diet and physical activities Evidence for depression or mood disorders  The patient's weight, height, BMI, and visual acuity have been recorded in the chart.  I have made referrals, counseling, and provided education to the patient based on review of the above and I  have provided the patient with a written personalized care plan for preventive services.    See Problem List for other items.   Thersa Salt, DO   03/20/2015

## 2015-03-20 NOTE — Patient Instructions (Addendum)
It was nice to see you today.  Use Miralax twice daily for constipation.  Consider switching to Lipitor.  Follow up annually or sooner if needed.  Take care  Dr. Lacinda Axon   Health Maintenance, Female Adopting a healthy lifestyle and getting preventive care can go a long way to promote health and wellness. Talk with your health care provider about what schedule of regular examinations is right for you. This is a good chance for you to check in with your provider about disease prevention and staying healthy. In between checkups, there are plenty of things you can do on your own. Experts have done a lot of research about which lifestyle changes and preventive measures are most likely to keep you healthy. Ask your health care provider for more information. WEIGHT AND DIET  Eat a healthy diet  Be sure to include plenty of vegetables, fruits, low-fat dairy products, and lean protein.  Do not eat a lot of foods high in solid fats, added sugars, or salt.  Get regular exercise. This is one of the most important things you can do for your health.  Most adults should exercise for at least 150 minutes each week. The exercise should increase your heart rate and make you sweat (moderate-intensity exercise).  Most adults should also do strengthening exercises at least twice a week. This is in addition to the moderate-intensity exercise.  Maintain a healthy weight  Body mass index (BMI) is a measurement that can be used to identify possible weight problems. It estimates body fat based on height and weight. Your health care provider can help determine your BMI and help you achieve or maintain a healthy weight.  For females 27 years of age and older:   A BMI below 18.5 is considered underweight.  A BMI of 18.5 to 24.9 is normal.  A BMI of 25 to 29.9 is considered overweight.  A BMI of 30 and above is considered obese.  Watch levels of cholesterol and blood lipids  You should start having your  blood tested for lipids and cholesterol at 70 years of age, then have this test every 5 years.  You may need to have your cholesterol levels checked more often if:  Your lipid or cholesterol levels are high.  You are older than 70 years of age.  You are at high risk for heart disease.  CANCER SCREENING   Lung Cancer  Lung cancer screening is recommended for adults 26-10 years old who are at high risk for lung cancer because of a history of smoking.  A yearly low-dose CT scan of the lungs is recommended for people who:  Currently smoke.  Have quit within the past 15 years.  Have at least a 30-pack-year history of smoking. A pack year is smoking an average of one pack of cigarettes a day for 1 year.  Yearly screening should continue until it has been 15 years since you quit.  Yearly screening should stop if you develop a health problem that would prevent you from having lung cancer treatment.  Breast Cancer  Practice breast self-awareness. This means understanding how your breasts normally appear and feel.  It also means doing regular breast self-exams. Let your health care provider know about any changes, no matter how small.  If you are in your 20s or 30s, you should have a clinical breast exam (CBE) by a health care provider every 1-3 years as part of a regular health exam.  If you are 40 or older, have  a CBE every year. Also consider having a breast X-ray (mammogram) every year.  If you have a family history of breast cancer, talk to your health care provider about genetic screening.  If you are at high risk for breast cancer, talk to your health care provider about having an MRI and a mammogram every year.  Breast cancer gene (BRCA) assessment is recommended for women who have family members with BRCA-related cancers. BRCA-related cancers include:  Breast.  Ovarian.  Tubal.  Peritoneal cancers.  Results of the assessment will determine the need for genetic  counseling and BRCA1 and BRCA2 testing. Cervical Cancer Your health care provider may recommend that you be screened regularly for cancer of the pelvic organs (ovaries, uterus, and vagina). This screening involves a pelvic examination, including checking for microscopic changes to the surface of your cervix (Pap test). You may be encouraged to have this screening done every 3 years, beginning at age 21.  For women ages 30-65, health care providers may recommend pelvic exams and Pap testing every 3 years, or they may recommend the Pap and pelvic exam, combined with testing for human papilloma virus (HPV), every 5 years. Some types of HPV increase your risk of cervical cancer. Testing for HPV may also be done on women of any age with unclear Pap test results.  Other health care providers may not recommend any screening for nonpregnant women who are considered low risk for pelvic cancer and who do not have symptoms. Ask your health care provider if a screening pelvic exam is right for you.  If you have had past treatment for cervical cancer or a condition that could lead to cancer, you need Pap tests and screening for cancer for at least 20 years after your treatment. If Pap tests have been discontinued, your risk factors (such as having a new sexual partner) need to be reassessed to determine if screening should resume. Some women have medical problems that increase the chance of getting cervical cancer. In these cases, your health care provider may recommend more frequent screening and Pap tests. Colorectal Cancer  This type of cancer can be detected and often prevented.  Routine colorectal cancer screening usually begins at 70 years of age and continues through 70 years of age.  Your health care provider may recommend screening at an earlier age if you have risk factors for colon cancer.  Your health care provider may also recommend using home test kits to check for hidden blood in the stool.  A  small camera at the end of a tube can be used to examine your colon directly (sigmoidoscopy or colonoscopy). This is done to check for the earliest forms of colorectal cancer.  Routine screening usually begins at age 50.  Direct examination of the colon should be repeated every 5-10 years through 70 years of age. However, you may need to be screened more often if early forms of precancerous polyps or small growths are found. Skin Cancer  Check your skin from head to toe regularly.  Tell your health care provider about any new moles or changes in moles, especially if there is a change in a mole's shape or color.  Also tell your health care provider if you have a mole that is larger than the size of a pencil eraser.  Always use sunscreen. Apply sunscreen liberally and repeatedly throughout the day.  Protect yourself by wearing long sleeves, pants, a wide-brimmed hat, and sunglasses whenever you are outside. HEART DISEASE, DIABETES, AND HIGH   BLOOD PRESSURE   High blood pressure causes heart disease and increases the risk of stroke. High blood pressure is more likely to develop in:  People who have blood pressure in the high end of the normal range (130-139/85-89 mm Hg).  People who are overweight or obese.  People who are African American.  If you are 18-13 years of age, have your blood pressure checked every 3-5 years. If you are 57 years of age or older, have your blood pressure checked every year. You should have your blood pressure measured twice--once when you are at a hospital or clinic, and once when you are not at a hospital or clinic. Record the average of the two measurements. To check your blood pressure when you are not at a hospital or clinic, you can use:  An automated blood pressure machine at a pharmacy.  A home blood pressure monitor.  If you are between 12 years and 71 years old, ask your health care provider if you should take aspirin to prevent strokes.  Have  regular diabetes screenings. This involves taking a blood sample to check your fasting blood sugar level.  If you are at a normal weight and have a low risk for diabetes, have this test once every three years after 70 years of age.  If you are overweight and have a high risk for diabetes, consider being tested at a younger age or more often. PREVENTING INFECTION  Hepatitis B  If you have a higher risk for hepatitis B, you should be screened for this virus. You are considered at high risk for hepatitis B if:  You were born in a country where hepatitis B is common. Ask your health care provider which countries are considered high risk.  Your parents were born in a high-risk country, and you have not been immunized against hepatitis B (hepatitis B vaccine).  You have HIV or AIDS.  You use needles to inject street drugs.  You live with someone who has hepatitis B.  You have had sex with someone who has hepatitis B.  You get hemodialysis treatment.  You take certain medicines for conditions, including cancer, organ transplantation, and autoimmune conditions. Hepatitis C  Blood testing is recommended for:  Everyone born from [birth year] through 1966.  Anyone with known risk factors for hepatitis C. Sexually transmitted infections (STIs)  You should be screened for sexually transmitted infections (STIs) including gonorrhea and chlamydia if:  You are sexually active and are younger than 70 years of age.  You are older than 70 years of age and your health care provider tells you that you are at risk for this type of infection.  Your sexual activity has changed since you were last screened and you are at an increased risk for chlamydia or gonorrhea. Ask your health care provider if you are at risk.  If you do not have HIV, but are at risk, it may be recommended that you take a prescription medicine daily to prevent HIV infection. This is called pre-exposure prophylaxis (PrEP). You are  considered at risk if:  You are sexually active and do not regularly use condoms or know the HIV status of your partner(s).  You take drugs by injection.  You are sexually active with a partner who has HIV. Talk with your health care provider about whether you are at high risk of being infected with HIV. If you choose to begin PrEP, you should first be tested for HIV. You should then be tested every  3 months for as long as you are taking PrEP.  PREGNANCY   If you are premenopausal and you may become pregnant, ask your health care provider about preconception counseling.  If you may become pregnant, take 400 to 800 micrograms (mcg) of folic acid every day.  If you want to prevent pregnancy, talk to your health care provider about birth control (contraception). OSTEOPOROSIS AND MENOPAUSE   Osteoporosis is a disease in which the bones lose minerals and strength with aging. This can result in serious bone fractures. Your risk for osteoporosis can be identified using a bone density scan.  If you are 65 years of age or older, or if you are at risk for osteoporosis and fractures, ask your health care provider if you should be screened.  Ask your health care provider whether you should take a calcium or vitamin D supplement to lower your risk for osteoporosis.  Menopause may have certain physical symptoms and risks.  Hormone replacement therapy may reduce some of these symptoms and risks. Talk to your health care provider about whether hormone replacement therapy is right for you.  HOME CARE INSTRUCTIONS   Schedule regular health, dental, and eye exams.  Stay current with your immunizations.   Do not use any tobacco products including cigarettes, chewing tobacco, or electronic cigarettes.  If you are pregnant, do not drink alcohol.  If you are breastfeeding, limit how much and how often you drink alcohol.  Limit alcohol intake to no more than 1 drink per day for nonpregnant women. One  drink equals 12 ounces of beer, 5 ounces of wine, or 1 ounces of hard liquor.  Do not use street drugs.  Do not share needles.  Ask your health care provider for help if you need support or information about quitting drugs.  Tell your health care provider if you often feel depressed.  Tell your health care provider if you have ever been abused or do not feel safe at home.   This information is not intended to replace advice given to you by your health care provider. Make sure you discuss any questions you have with your health care provider.   Document Released: 12/10/2010 Document Revised: 06/17/2014 Document Reviewed: 04/28/2013 Elsevier Interactive Patient Education 2016 [employer]  

## 2015-03-20 NOTE — Assessment & Plan Note (Signed)
Discussed switch from simvastatin to atorvastatin given elevated ASCVD risk score. Patient will think about this and get back in touch with me.

## 2015-03-20 NOTE — Progress Notes (Signed)
Pre visit review using our clinic review tool, if applicable. No additional management support is needed unless otherwise documented below in the visit note. 

## 2015-04-17 DIAGNOSIS — H2511 Age-related nuclear cataract, right eye: Secondary | ICD-10-CM | POA: Diagnosis not present

## 2015-04-17 DIAGNOSIS — Z961 Presence of intraocular lens: Secondary | ICD-10-CM | POA: Diagnosis not present

## 2015-04-17 DIAGNOSIS — H25811 Combined forms of age-related cataract, right eye: Secondary | ICD-10-CM | POA: Diagnosis not present

## 2015-04-18 DIAGNOSIS — H2512 Age-related nuclear cataract, left eye: Secondary | ICD-10-CM | POA: Diagnosis not present

## 2015-04-19 ENCOUNTER — Ambulatory Visit (INDEPENDENT_AMBULATORY_CARE_PROVIDER_SITE_OTHER): Payer: Medicare Other

## 2015-04-19 VITALS — BP 142/82 | HR 72 | Resp 18

## 2015-04-19 DIAGNOSIS — IMO0001 Reserved for inherently not codable concepts without codable children: Secondary | ICD-10-CM

## 2015-04-19 DIAGNOSIS — R030 Elevated blood-pressure reading, without diagnosis of hypertension: Secondary | ICD-10-CM

## 2015-04-19 NOTE — Progress Notes (Signed)
Patient came in for two week BP check.  I thas actually been greater than two weeks as patient had cataract surgery last week.  She gave me a copy of her BP's checks from home,  10/13 940am: 112/81 10/14 1030am: 142/77 10/15 11am: 145/78 10/16 3pm: 141/70 10/17 330pm: 136/71 10/18 430pm: 123/63 10/19 10am: 125/91 10/21 noon: 138/79 10/23 4pm: 135/75 10/24 1130am: 140/78 10/25 230pm: 136/75 10/27 noon: 126/71 10/28 noon: 129/73 10/29 noon: 142/71  Patient stated that she noticed a trend with her pressure being better when she was going to the [employer].    Would like a call or mychart message with update.   I checked bilateral arms, see vitals for details.   Please advise

## 2015-04-21 ENCOUNTER — Encounter: Payer: Self-pay | Admitting: Family Medicine

## 2015-04-25 ENCOUNTER — Encounter: Payer: Self-pay | Admitting: Family Medicine

## 2015-04-28 DIAGNOSIS — Z1231 Encounter for screening mammogram for malignant neoplasm of breast: Secondary | ICD-10-CM | POA: Diagnosis not present

## 2015-04-28 DIAGNOSIS — M8589 Other specified disorders of bone density and structure, multiple sites: Secondary | ICD-10-CM | POA: Diagnosis not present

## 2015-04-28 LAB — HM MAMMOGRAPHY: HM Mammogram: NORMAL

## 2015-05-08 DIAGNOSIS — Z961 Presence of intraocular lens: Secondary | ICD-10-CM | POA: Diagnosis not present

## 2015-05-08 DIAGNOSIS — H2512 Age-related nuclear cataract, left eye: Secondary | ICD-10-CM | POA: Diagnosis not present

## 2015-05-08 DIAGNOSIS — H25812 Combined forms of age-related cataract, left eye: Secondary | ICD-10-CM | POA: Diagnosis not present

## 2015-05-10 ENCOUNTER — Encounter: Payer: Self-pay | Admitting: Family Medicine

## 2015-05-22 ENCOUNTER — Encounter: Payer: Self-pay | Admitting: Family Medicine

## 2015-05-30 ENCOUNTER — Other Ambulatory Visit: Payer: Self-pay

## 2015-05-30 MED ORDER — POLYETHYLENE GLYCOL 3350 17 GM/SCOOP PO POWD: 17.0000 g | Freq: Two times a day (BID) | ORAL | Status: DC | PRN

## 2015-06-01 ENCOUNTER — Other Ambulatory Visit: Payer: Self-pay

## 2015-06-01 MED ORDER — SIMVASTATIN 20 MG PO TABS: 20.0000 mg | ORAL_TABLET | Freq: Every day | ORAL | Status: DC

## 2015-06-01 NOTE — Telephone Encounter (Signed)
Patient called requesting a refill on Simvastatin. Ok to refill this medication?

## 2015-08-04 ENCOUNTER — Encounter: Payer: Self-pay | Admitting: Gastroenterology

## 2015-11-23 ENCOUNTER — Encounter: Payer: Self-pay | Admitting: Family Medicine

## 2015-11-23 ENCOUNTER — Ambulatory Visit (INDEPENDENT_AMBULATORY_CARE_PROVIDER_SITE_OTHER): Payer: Medicare Other | Admitting: Family Medicine

## 2015-11-23 VITALS — BP 135/84 | HR 79 | Temp 97.9°F | Ht 67.75 in | Wt 159.8 lb

## 2015-11-23 DIAGNOSIS — R05 Cough: Secondary | ICD-10-CM | POA: Diagnosis not present

## 2015-11-23 DIAGNOSIS — R059 Cough, unspecified: Secondary | ICD-10-CM | POA: Insufficient documentation

## 2015-11-23 MED ORDER — HYDROCOD POLST-CPM POLST ER 10-8 MG/5ML PO SUER: 5.0000 mL | Freq: Two times a day (BID) | ORAL | Status: DC | PRN

## 2015-11-23 MED ORDER — PREDNISONE 50 MG PO TABS: ORAL_TABLET | ORAL | Status: DC

## 2015-11-23 NOTE — Patient Instructions (Signed)
This appears to be viral. Use the medications as prescribed. Please let me know if you fail to improve or worsen and I'm happy to treat you with antibiotics.  Take care  Dr. Lacinda Axon

## 2015-11-23 NOTE — Progress Notes (Signed)
Pre visit review using our clinic review tool, if applicable. No additional management support is needed unless otherwise documented below in the visit note. 

## 2015-11-23 NOTE — Assessment & Plan Note (Signed)
New problem. Likely viral in origin. Treating with prednisone and Tussionex.

## 2015-11-23 NOTE — Progress Notes (Signed)
Subjective:  Patient ID: Natasha Mendoza, female    DOB: [birth year]  Age: 70 y.o. MRN: [id number]  CC: Cough, congestion  HPI:  70 year old female presents with the above complaints.  Patient recently went on an [place] cruise. Upon her return she developed sore throat. This progressed to sinus pressure, congestion, and pain on Tuesday and Wednesday. Her sinus pressure and congestion improved but she subsequently developed cough yesterday. Cough is severe. Wet cough but not particularly productive. No associated fevers or chills. No shortness of breath. She does report associated fatigue. No known exacerbating or relieving factors. No other complaints at this time.  Social Hx   Social History   Social History  . Marital Status: Married    Spouse Name: N/A  . Number of Children: 3  . Years of Education: N/A   Occupational History  . Retired     Social History Main Topics  . Smoking status: Never Smoker   . Smokeless tobacco: Never Used  . Alcohol Use: 0.6 oz/week    1 Glasses of wine per week     Comment: occasionally  . Drug Use: No  . Sexual Activity: Yes   Other Topics Concern  . None   Social History Narrative   Exercise: elliptical 4-5 days a week at [employer].    Healthy eating habits, no fried foods.   Has HCPOA: husband or daughter, Has living will. Full code (Reviewed 2015)   2 caffeine drinks daily    Review of Systems  Constitutional: Positive for fatigue. Negative for fever and chills.  HENT: Positive for congestion.   Respiratory: Positive for cough.    Objective:  BP 135/84 mmHg  Pulse 79  Temp(Src) 97.9 F (36.6 C) (Oral)  Ht 5' 7.75" (1.721 m)  Wt 159 lb 12 oz (72.462 kg)  BMI 24.47 kg/m2  SpO2 97%  BP/Weight 11/23/2015 04/19/2015 [id number]  Systolic BP A999333 [id number] [id number]  Diastolic BP 84 82 96  Wt. (Lbs) 159.75 - 162.38  BMI 24.47 - 24.87   Physical Exam  Constitutional: She is oriented to person, place, and time. She appears well-developed. No  distress.  HENT:  Mouth/Throat: Oropharynx is clear and moist.  Normal TM's bilaterally.    Eyes: Conjunctivae are normal.  Neck: Neck supple.  Cardiovascular: Normal rate and regular rhythm.   Pulmonary/Chest: Effort normal. She has no wheezes. She has no rales.  Lymphadenopathy:    She has no cervical adenopathy.  Neurological: She is alert and oriented to person, place, and time.  Psychiatric: She has a normal mood and affect.  Vitals reviewed.  Lab Results  Component Value Date   WBC 6.5 03/15/2015   HGB 14.7 03/15/2015   HCT 43.5 03/15/2015   PLT 291.0 03/15/2015   GLUCOSE 103* 03/15/2015   CHOL 162 03/15/2015   TRIG 100.0 03/15/2015   HDL 57.70 03/15/2015   LDLCALC 85 03/15/2015   ALT 28 03/15/2015   AST 12 03/15/2015   NA 140 03/15/2015   K 4.6 03/15/2015   CL 104 03/15/2015   CREATININE 0.79 03/15/2015   BUN 15 03/15/2015   CO2 31 03/15/2015   TSH 3.85 08/30/2008   HGBA1C 5.9 03/10/2014   Assessment & Plan:   Problem List Items Addressed This Visit    Cough - Primary    New problem. Likely viral in origin. Treating with prednisone and Tussionex.         Meds ordered this encounter  Medications  . predniSONE (DELTASONE) 50 MG  tablet    Sig: 1 tablet daily for 5 days.    Dispense:  5 tablet    Refill:  0  . chlorpheniramine-HYDROcodone (TUSSIONEX PENNKINETIC ER) 10-8 MG/5ML SUER    Sig: Take 5 mLs by mouth every 12 (twelve) hours as needed.    Dispense:  115 mL    Refill:  0    Follow-up: PRN  [place]

## 2016-03-27 ENCOUNTER — Telehealth: Payer: Self-pay | Admitting: Family Medicine

## 2016-03-27 NOTE — Telephone Encounter (Signed)
Do you want to see her first? 

## 2016-03-27 NOTE — Telephone Encounter (Signed)
Pt would like to get labs done before her appt on 04/05/16. Need orders please and thank you!  Call pt @ [phone].

## 2016-03-29 ENCOUNTER — Encounter: Payer: Self-pay | Admitting: Family Medicine

## 2016-03-29 ENCOUNTER — Other Ambulatory Visit: Payer: Self-pay | Admitting: Family Medicine

## 2016-03-29 DIAGNOSIS — R7303 Prediabetes: Secondary | ICD-10-CM

## 2016-03-29 DIAGNOSIS — Z130 Encounter for screening for diseases of the blood and blood-forming organs and certain disorders involving the immune mechanism: Secondary | ICD-10-CM

## 2016-03-29 DIAGNOSIS — R030 Elevated blood-pressure reading, without diagnosis of hypertension: Secondary | ICD-10-CM

## 2016-03-29 DIAGNOSIS — E785 Hyperlipidemia, unspecified: Secondary | ICD-10-CM

## 2016-03-29 NOTE — Telephone Encounter (Signed)
Orders in. She can set up a time to come in.

## 2016-03-29 NOTE — Telephone Encounter (Signed)
Ok. I left a vm for pt to call the office. Thank you!

## 2016-04-01 ENCOUNTER — Other Ambulatory Visit (INDEPENDENT_AMBULATORY_CARE_PROVIDER_SITE_OTHER): Payer: Medicare Other

## 2016-04-01 DIAGNOSIS — R030 Elevated blood-pressure reading, without diagnosis of hypertension: Secondary | ICD-10-CM | POA: Diagnosis not present

## 2016-04-01 DIAGNOSIS — E785 Hyperlipidemia, unspecified: Secondary | ICD-10-CM

## 2016-04-01 DIAGNOSIS — R7303 Prediabetes: Secondary | ICD-10-CM | POA: Diagnosis not present

## 2016-04-01 LAB — COMPREHENSIVE METABOLIC PANEL
ALBUMIN: 4.3 g/dL (ref 3.5–5.2)
ALT: 24 U/L (ref 0–35)
AST: 9 U/L (ref 0–37)
Alkaline Phosphatase: 89 U/L (ref 39–117)
BUN: 16 mg/dL (ref 6–23)
CALCIUM: 9.4 mg/dL (ref 8.4–10.5)
CHLORIDE: 106 meq/L (ref 96–112)
CO2: 32 meq/L (ref 19–32)
Creatinine, Ser: 0.76 mg/dL (ref 0.40–1.20)
GFR: 79.75 mL/min (ref >60.00)
Glucose, Bld: 103 mg/dL — ABNORMAL HIGH (ref 70–99)
POTASSIUM: 4.5 meq/L (ref 3.5–5.1)
Sodium: 142 mEq/L (ref 135–145)
Total Bilirubin: 0.4 mg/dL (ref 0.2–1.2)
Total Protein: 7 g/dL (ref 6.0–8.3)

## 2016-04-01 LAB — HEMOGLOBIN A1C: Hgb A1c MFr Bld: 5.9 % (ref 4.6–6.5)

## 2016-04-01 LAB — LIPID PANEL
CHOL/HDL RATIO: 3
Cholesterol: 160 mg/dL (ref 0–200)
HDL: 53.8 mg/dL (ref >39.00)
LDL CALC: 85 mg/dL (ref 0–99)
NONHDL: 105.98
TRIGLYCERIDES: 107 mg/dL (ref 0.0–149.0)
VLDL: 21.4 mg/dL (ref 0.0–40.0)

## 2016-04-05 ENCOUNTER — Telehealth: Payer: Self-pay | Admitting: Family Medicine

## 2016-04-05 ENCOUNTER — Encounter: Payer: Self-pay | Admitting: Family Medicine

## 2016-04-05 ENCOUNTER — Ambulatory Visit (INDEPENDENT_AMBULATORY_CARE_PROVIDER_SITE_OTHER): Payer: Medicare Other | Admitting: Family Medicine

## 2016-04-05 VITALS — BP 124/78 | HR 81 | Temp 97.8°F | Wt 163.4 lb

## 2016-04-05 DIAGNOSIS — R0609 Other forms of dyspnea: Secondary | ICD-10-CM

## 2016-04-05 DIAGNOSIS — R232 Flushing: Secondary | ICD-10-CM | POA: Insufficient documentation

## 2016-04-05 MED ORDER — VENLAFAXINE HCL ER 37.5 MG PO CP24: 37.5000 mg | ORAL_CAPSULE | Freq: Every day | ORAL | 0 refills | Status: DC

## 2016-04-05 MED ORDER — TIOTROPIUM BROMIDE MONOHYDRATE 2.5 MCG/ACT IN AERS: INHALATION_SPRAY | RESPIRATORY_TRACT | 0 refills | Status: DC

## 2016-04-05 NOTE — Assessment & Plan Note (Signed)
New problem. Does not appear to be cardiac in etiology as she has no chest pain and is able to exercise vigorously at the gym. Possible component of underlying lung disease given secondhand smoke history as well as the fact that the activities that provoke her shortness of breath are associated with allergens (outside playing golf and vacuuming). Discussed PFTs versus trial of inhaler. Patient wanted to wait on PFTs. Trial of Spiriva. Sample given today.

## 2016-04-05 NOTE — Assessment & Plan Note (Signed)
New problem (to me). Starting Effexor.

## 2016-04-05 NOTE — Telephone Encounter (Signed)
I called pt and left a vm to schedule AWV. Thank you! °

## 2016-04-05 NOTE — Progress Notes (Signed)
Subjective:  Patient ID: Natasha Mendoza, female    DOB: September 24, 1945  Age: 70 y.o. MRN: [id number]  CC: SOB, Hot flashes  HPI:  70 year old female with osteopenia, hyperlipidemia, chronic constipation presents with the above complaints.  SOB  Patient reports that she's has shortness of breath with exertion for the past 4 months.  She states that it started after her visit with me in June where she was having significant cough.  She states that she is able to go to the gym and exercise without difficulty.  She has shortness of breath with exertion when playing golf and with vacuuming the house.  No associated chest pain.  No reports of PND or orthopnea.  She is a nonsmoker.  She has had a lot of secondhand smoke throughout her lifetime.  She did take saw her husband COPD medications with some improvement.  Hot flashes  Patient reports she's having frequent hot flashes.  She is very bothered by this and would like to discuss treatment options.  She states that she was on Effexor in the past and had good results. She states that she stopped it because her daughter who is a pharmacist informed her that it is addicting.  Social Hx   Social History   Social History  . Marital status: Married    Spouse name: N/A  . Number of children: 3  . Years of education: N/A   Occupational History  . Retired     Social History Main Topics  . Smoking status: Never Smoker  . Smokeless tobacco: Never Used  . Alcohol use 0.6 oz/week    1 Glasses of wine per week     Comment: occasionally  . Drug use: No  . Sexual activity: Yes   Other Topics Concern  . None   Social History Narrative   Exercise: elliptical 4-5 days a week at [employer].    Healthy eating habits, no fried foods.   Has HCPOA: husband or daughter, Has living will. Full code (Reviewed 2015)   2 caffeine drinks daily     Review of Systems  Respiratory: Positive for shortness of breath.   Cardiovascular:  Negative for chest pain.   Objective:  BP 124/78 (BP Location: Right Arm, Patient Position: Sitting, Cuff Size: Normal)   Pulse 81   Temp 97.8 F (36.6 C) (Oral)   Wt 163 lb 6 oz (74.1 kg)   SpO2 98%   BMI 25.02 kg/m   BP/Weight 04/05/2016 11/23/2015 [id number]  Systolic BP A999333 A999333 A999333  Diastolic BP 78 84 82  Wt. (Lbs) 163.38 159.75 -  BMI 25.02 24.47 -   Physical Exam  Constitutional: She is oriented to person, place, and time. She appears well-developed. No distress.  Cardiovascular: Normal rate and regular rhythm.   Pulmonary/Chest: No respiratory distress. She has no wheezes. She has no rales.  Neurological: She is alert and oriented to person, place, and time.  Psychiatric: She has a normal mood and affect.  Vitals reviewed.  Lab Results  Component Value Date   WBC 6.5 03/15/2015   HGB 14.7 03/15/2015   HCT 43.5 03/15/2015   PLT 291.0 03/15/2015   GLUCOSE 103 (H) 04/01/2016   CHOL 160 04/01/2016   TRIG 107.0 04/01/2016   HDL 53.80 04/01/2016   LDLCALC 85 04/01/2016   ALT 24 04/01/2016   AST 9 04/01/2016   NA 142 04/01/2016   K 4.5 04/01/2016   CL 106 04/01/2016   CREATININE 0.76 04/01/2016   BUN  16 04/01/2016   CO2 32 04/01/2016   TSH 3.85 08/30/2008   HGBA1C 5.9 04/01/2016    Assessment & Plan:   Problem List Items Addressed This Visit    Hot flashes    New problem (to me). Starting Effexor.      DOE (dyspnea on exertion) - Primary    New problem. Does not appear to be cardiac in etiology as she has no chest pain and is able to exercise vigorously at the gym. Possible component of underlying lung disease given secondhand smoke history as well as the fact that the activities that provoke her shortness of breath are associated with allergens (outside playing golf and vacuuming). Discussed PFTs versus trial of inhaler. Patient wanted to wait on PFTs. Trial of Spiriva. Sample given today.       Other Visit Diagnoses   None.     Meds ordered this  encounter  Medications  . venlafaxine XR (EFFEXOR-XR) 37.5 MG 24 hr capsule    Sig: Take 1 capsule (37.5 mg total) by mouth daily with breakfast.    Dispense:  90 capsule    Refill:  0   Follow-up: Later this year if symptoms do not improve or worsen  [place]

## 2016-04-05 NOTE — Progress Notes (Signed)
Pre visit review using our clinic review tool, if applicable. No additional management support is needed unless otherwise documented below in the visit note. 

## 2016-04-05 NOTE — Patient Instructions (Signed)
Take the spiriva daily.  Take the effexor daily.  Follow up in the next few months if you continue to have SOB.  Take care  Dr. Lacinda Axon

## 2016-04-30 DIAGNOSIS — Z1231 Encounter for screening mammogram for malignant neoplasm of breast: Secondary | ICD-10-CM | POA: Diagnosis not present

## 2016-04-30 DIAGNOSIS — Z803 Family history of malignant neoplasm of breast: Secondary | ICD-10-CM | POA: Diagnosis not present

## 2016-04-30 LAB — HM MAMMOGRAPHY

## 2016-05-01 ENCOUNTER — Encounter: Payer: Self-pay | Admitting: Family Medicine

## 2016-05-07 ENCOUNTER — Telehealth: Payer: Self-pay | Admitting: Family Medicine

## 2016-05-07 NOTE — Telephone Encounter (Signed)
Pt declined to get AWV. Thank you!

## 2016-06-05 ENCOUNTER — Other Ambulatory Visit: Payer: Self-pay | Admitting: Family Medicine

## 2016-09-04 ENCOUNTER — Other Ambulatory Visit: Payer: Self-pay | Admitting: Family Medicine

## 2016-10-03 ENCOUNTER — Telehealth: Payer: Self-pay | Admitting: Family Medicine

## 2016-10-03 NOTE — Telephone Encounter (Signed)
Left  Message for patient to return call to office. 

## 2016-10-03 NOTE — Telephone Encounter (Signed)
Okay to order?

## 2016-10-03 NOTE — Telephone Encounter (Signed)
Patient returned call and was able to close 4 out 5 quality metric Gaps, patient declined pneumonia vaccine, depression screening completed and fall-risk, updated mammogram in chart, patient consented to a cologuard screening stated she had discussed with PCP at first visit but was never ordered ok to order?

## 2016-10-04 NOTE — Telephone Encounter (Signed)
Cologuard ordered

## 2016-10-22 DIAGNOSIS — Z1212 Encounter for screening for malignant neoplasm of rectum: Secondary | ICD-10-CM | POA: Diagnosis not present

## 2016-10-22 DIAGNOSIS — Z1211 Encounter for screening for malignant neoplasm of colon: Secondary | ICD-10-CM | POA: Diagnosis not present

## 2016-10-29 ENCOUNTER — Telehealth: Payer: Self-pay | Admitting: Family Medicine

## 2016-10-29 LAB — COLOGUARD: COLOGUARD: NEGATIVE

## 2016-10-29 NOTE — Telephone Encounter (Signed)
Left pt message asking to call Allison back directly at [phone] to schedule AWV. Thanks! °

## 2016-12-10 NOTE — Telephone Encounter (Signed)
Pt declined AWV. °

## 2017-03-06 ENCOUNTER — Telehealth: Payer: Self-pay | Admitting: Family Medicine

## 2017-03-06 NOTE — Telephone Encounter (Signed)
Please advise 

## 2017-03-06 NOTE — Telephone Encounter (Signed)
Patient notified

## 2017-03-06 NOTE — Telephone Encounter (Signed)
Pt called and switched from Dr. Lacinda Axon to Dr.Sonnenberg. Pt is scheduled for a physical on 06/20/17. Pt would like to know if she can have labs before. Please advise, thank you!

## 2017-03-06 NOTE — Telephone Encounter (Signed)
I prefer to do labs at the time of the visit. If the patient is okay with this we can wait until I see her and then order lab work. Thanks.

## 2017-03-17 DIAGNOSIS — Z961 Presence of intraocular lens: Secondary | ICD-10-CM | POA: Diagnosis not present

## 2017-04-28 DIAGNOSIS — L42 Pityriasis rosea: Secondary | ICD-10-CM | POA: Diagnosis not present

## 2017-06-20 ENCOUNTER — Encounter: Payer: Self-pay | Admitting: Family Medicine

## 2017-06-20 ENCOUNTER — Other Ambulatory Visit: Payer: Self-pay

## 2017-06-20 ENCOUNTER — Other Ambulatory Visit: Payer: Self-pay | Admitting: Family Medicine

## 2017-06-20 ENCOUNTER — Ambulatory Visit (INDEPENDENT_AMBULATORY_CARE_PROVIDER_SITE_OTHER): Payer: Medicare Other | Admitting: Family Medicine

## 2017-06-20 VITALS — BP 160/90 | HR 79 | Temp 98.1°F | Ht 66.5 in | Wt 173.4 lb

## 2017-06-20 DIAGNOSIS — E663 Overweight: Secondary | ICD-10-CM | POA: Diagnosis not present

## 2017-06-20 DIAGNOSIS — L989 Disorder of the skin and subcutaneous tissue, unspecified: Secondary | ICD-10-CM | POA: Diagnosis not present

## 2017-06-20 DIAGNOSIS — Z1231 Encounter for screening mammogram for malignant neoplasm of breast: Secondary | ICD-10-CM

## 2017-06-20 DIAGNOSIS — Z1239 Encounter for other screening for malignant neoplasm of breast: Secondary | ICD-10-CM

## 2017-06-20 DIAGNOSIS — E785 Hyperlipidemia, unspecified: Secondary | ICD-10-CM

## 2017-06-20 DIAGNOSIS — R21 Rash and other nonspecific skin eruption: Secondary | ICD-10-CM | POA: Insufficient documentation

## 2017-06-20 DIAGNOSIS — I10 Essential (primary) hypertension: Secondary | ICD-10-CM

## 2017-06-20 DIAGNOSIS — Z780 Asymptomatic menopausal state: Secondary | ICD-10-CM

## 2017-06-20 DIAGNOSIS — R7303 Prediabetes: Secondary | ICD-10-CM

## 2017-06-20 DIAGNOSIS — M8580 Other specified disorders of bone density and structure, unspecified site: Secondary | ICD-10-CM

## 2017-06-20 LAB — BASIC METABOLIC PANEL
BUN: 15 mg/dL (ref 6–23)
CALCIUM: 9.3 mg/dL (ref 8.4–10.5)
CO2: 29 mEq/L (ref 19–32)
CREATININE: 0.73 mg/dL (ref 0.40–1.20)
Chloride: 101 mEq/L (ref 96–112)
GFR: 83.26 mL/min (ref >60.00)
Glucose, Bld: 104 mg/dL — ABNORMAL HIGH (ref 70–99)
Potassium: 4.1 mEq/L (ref 3.5–5.1)
SODIUM: 137 meq/L (ref 135–145)

## 2017-06-20 MED ORDER — LOSARTAN POTASSIUM 50 MG PO TABS: 50.0000 mg | ORAL_TABLET | Freq: Every day | ORAL | 3 refills | Status: DC

## 2017-06-20 MED ORDER — POLYETHYLENE GLYCOL 3350 17 GM/SCOOP PO POWD: 17.0000 g | Freq: Two times a day (BID) | ORAL | 1 refills | Status: AC | PRN

## 2017-06-20 MED ORDER — SIMVASTATIN 20 MG PO TABS: 20.0000 mg | ORAL_TABLET | Freq: Every day | ORAL | 3 refills | Status: DC

## 2017-06-20 NOTE — Progress Notes (Signed)
Tommi Rumps, MD Phone: [phone]  Natasha Mendoza is a 72 y.o. female who presents today for follow-up.  Hypertension: New diagnosis.  Reports it was high at urgent care recently.  Has not checked in some time.  No chest pain, shortness of breath, or edema.  Osteopenia: Noted previously.  Due for DEXA scan.  She does not take calcium or vitamin D.  She reports a new skin lesion right flank and left anterior abdomen.  She did have a rash on her abdomen and was evaluated in urgent care and advised it was pityriasis.  She works on diet though does like sweet foods.  Tries not eat any fatty foods.  [facility]  She is due for lab work and mammogram as well as DEXA scan.  Social History   Tobacco Use  Smoking Status Never Smoker  Smokeless Tobacco Never Used     ROS see history of present illness  Objective  Physical Exam Vitals:   06/20/17 1330  BP: (!) 160/90  Pulse: 79  Temp: 98.1 F (36.7 C)  SpO2: 98%    BP Readings from Last 3 Encounters:  06/20/17 (!) 160/90  04/05/16 124/78  11/23/15 135/84   Wt Readings from Last 3 Encounters:  06/20/17 173 lb 6.4 oz (78.7 kg)  04/05/16 163 lb 6 oz (74.1 kg)  11/23/15 159 lb 12 oz (72.5 kg)    Physical Exam  Constitutional: No distress.  HENT:  Head: Normocephalic and atraumatic.  Mouth/Throat: Oropharynx is clear and moist.  Eyes: Conjunctivae are normal. Pupils are equal, round, and reactive to light.  Neck: Neck supple.  Cardiovascular: Normal rate, regular rhythm and normal heart sounds.  Pulmonary/Chest: Effort normal and breath sounds normal.  Abdominal: Soft. Bowel sounds are normal. She exhibits no distension. There is no tenderness. There is no rebound and no guarding.  Musculoskeletal: She exhibits no edema.  Lymphadenopathy:    She has no cervical adenopathy.  Neurological: She is alert. Gait normal.  Skin: Skin is warm and dry. She is not diaphoretic.  Seborrheic keratosis  noted on left upper abdomen and right flank   Assessment/Plan: Please see individual problem list.  Hyperlipidemia Refill simvastatin.  She will return for fasting lab work.  Essential hypertension Elevated today.  She reports prior elevation to similar levels at urgent care.  She is interested in starting on medication.  Will check a BMP today and consider starting on ARB.  Osteopenia Recheck DEXA scan.  Overweight Encourage diet and exercise.  Check labs.  Skin lesion Consistent with seborrheic keratosis.  Refer to dermatology for evaluation.   Garyn was seen today for annual exam.  Diagnoses and all orders for this visit:  Hyperlipidemia, unspecified hyperlipidemia type -     Comp Met (CMET); Future -     Lipid panel; Future  Osteopenia, unspecified location  Postmenopausal estrogen deficiency -     DG Bone Density; Future  Breast cancer screening -     MM SCREENING BREAST TOMO BILATERAL; Future  Essential hypertension -     Basic Metabolic Panel (BMET)  Skin lesion -     Ambulatory referral to Dermatology  Prediabetes -     HgB A1c; Future  Overweight  Other orders -     polyethylene glycol powder (GLYCOLAX/MIRALAX) powder; Take 17 g by mouth 2 (two) times daily as needed. -     simvastatin (ZOCOR) 20 MG tablet; Take 1 tablet (20 mg total) by  mouth at bedtime.    Orders Placed This Encounter  Procedures  . DG Bone Density    Standing Status:   Future    Standing Expiration Date:   08/19/2018    Order Specific Question:   Reason for Exam (SYMPTOM  OR DIAGNOSIS REQUIRED)    Answer:   osteopenia, postmenopausal estrogen deficiency    Order Specific Question:   Preferred imaging location?    Answer:   [facility]  . MM SCREENING BREAST TOMO BILATERAL    Standing Status:   Future    Standing Expiration Date:   08/19/2018    Order Specific Question:   Reason for Exam (SYMPTOM  OR DIAGNOSIS REQUIRED)    Answer:   breast cancer screening    Order  Specific Question:   Preferred imaging location?    Answer:   [facility]  . Comp Met (CMET)    Standing Status:   Future    Standing Expiration Date:   06/20/2018  . Lipid panel    Standing Status:   Future    Standing Expiration Date:   06/20/2018  . HgB A1c    Standing Status:   Future    Standing Expiration Date:   06/20/2018  . Basic Metabolic Panel (BMET)  . Ambulatory referral to Dermatology    Referral Priority:   Routine    Referral Type:   Consultation    Referral Reason:   Specialty Services Required    Requested Specialty:   Dermatology    Number of Visits Requested:   1    Meds ordered this encounter  Medications  . polyethylene glycol powder (GLYCOLAX/MIRALAX) powder    Sig: Take 17 g by mouth 2 (two) times daily as needed.    Dispense:  850 g    Refill:  1  . simvastatin (ZOCOR) 20 MG tablet    Sig: Take 1 tablet (20 mg total) by mouth at bedtime.    Dispense:  90 tablet    Refill:  [place], MD [place]

## 2017-06-20 NOTE — Assessment & Plan Note (Signed)
Recheck DEXA scan 

## 2017-06-20 NOTE — Assessment & Plan Note (Signed)
Encourage diet and exercise.  Check labs.

## 2017-06-20 NOTE — Assessment & Plan Note (Signed)
Consistent with seborrheic keratosis.  Refer to dermatology for evaluation.

## 2017-06-20 NOTE — Assessment & Plan Note (Signed)
Refill simvastatin.  She will return for fasting lab work.

## 2017-06-20 NOTE — Assessment & Plan Note (Signed)
Elevated today.  She reports prior elevation to similar levels at urgent care.  She is interested in starting on medication.  Will check a BMP today and consider starting on ARB.

## 2017-06-20 NOTE — Patient Instructions (Signed)
Nice to meet you. We will get you to see dermatology. Please continue to work on diet and exercise. We will check lab work today and in 1 week.  Once I get your lab work for today we will send in a blood pressure medication for you. Please set up your mammogram and bone density test.

## 2017-06-27 ENCOUNTER — Other Ambulatory Visit (INDEPENDENT_AMBULATORY_CARE_PROVIDER_SITE_OTHER): Payer: Medicare Other

## 2017-06-27 ENCOUNTER — Encounter: Payer: Self-pay | Admitting: Family Medicine

## 2017-06-27 DIAGNOSIS — R7303 Prediabetes: Secondary | ICD-10-CM

## 2017-06-27 DIAGNOSIS — E785 Hyperlipidemia, unspecified: Secondary | ICD-10-CM | POA: Diagnosis not present

## 2017-06-27 LAB — LIPID PANEL
Cholesterol: 176 mg/dL (ref 0–200)
HDL: 53 mg/dL (ref >39.00)
LDL Cholesterol: 99 mg/dL (ref 0–99)
NonHDL: 122.96
TRIGLYCERIDES: 121 mg/dL (ref 0.0–149.0)
Total CHOL/HDL Ratio: 3
VLDL: 24.2 mg/dL (ref 0.0–40.0)

## 2017-06-27 LAB — HEMOGLOBIN A1C: HEMOGLOBIN A1C: 5.9 % (ref 4.6–6.5)

## 2017-06-27 LAB — COMPREHENSIVE METABOLIC PANEL
ALK PHOS: 78 U/L (ref 39–117)
ALT: 18 U/L (ref 0–35)
AST: 9 U/L (ref 0–37)
Albumin: 4.2 g/dL (ref 3.5–5.2)
BILIRUBIN TOTAL: 0.5 mg/dL (ref 0.2–1.2)
BUN: 12 mg/dL (ref 6–23)
CALCIUM: 9.7 mg/dL (ref 8.4–10.5)
CO2: 31 mEq/L (ref 19–32)
Chloride: 104 mEq/L (ref 96–112)
Creatinine, Ser: 0.75 mg/dL (ref 0.40–1.20)
GFR: 80.69 mL/min (ref >60.00)
Glucose, Bld: 105 mg/dL — ABNORMAL HIGH (ref 70–99)
Potassium: 4.5 mEq/L (ref 3.5–5.1)
Sodium: 140 mEq/L (ref 135–145)
TOTAL PROTEIN: 7.2 g/dL (ref 6.0–8.3)

## 2017-07-29 DIAGNOSIS — M8589 Other specified disorders of bone density and structure, multiple sites: Secondary | ICD-10-CM | POA: Diagnosis not present

## 2017-07-29 DIAGNOSIS — Z1231 Encounter for screening mammogram for malignant neoplasm of breast: Secondary | ICD-10-CM | POA: Diagnosis not present

## 2017-07-31 DIAGNOSIS — B009 Herpesviral infection, unspecified: Secondary | ICD-10-CM | POA: Diagnosis not present

## 2017-07-31 DIAGNOSIS — D1801 Hemangioma of skin and subcutaneous tissue: Secondary | ICD-10-CM | POA: Diagnosis not present

## 2017-07-31 DIAGNOSIS — L814 Other melanin hyperpigmentation: Secondary | ICD-10-CM | POA: Diagnosis not present

## 2017-07-31 DIAGNOSIS — L570 Actinic keratosis: Secondary | ICD-10-CM | POA: Diagnosis not present

## 2017-07-31 DIAGNOSIS — L820 Inflamed seborrheic keratosis: Secondary | ICD-10-CM | POA: Diagnosis not present

## 2017-07-31 DIAGNOSIS — L821 Other seborrheic keratosis: Secondary | ICD-10-CM | POA: Diagnosis not present

## 2017-07-31 DIAGNOSIS — D225 Melanocytic nevi of trunk: Secondary | ICD-10-CM | POA: Diagnosis not present

## 2017-07-31 DIAGNOSIS — D485 Neoplasm of uncertain behavior of skin: Secondary | ICD-10-CM | POA: Diagnosis not present

## 2017-08-12 DIAGNOSIS — L570 Actinic keratosis: Secondary | ICD-10-CM | POA: Diagnosis not present

## 2017-08-17 ENCOUNTER — Telehealth: Payer: Self-pay | Admitting: Family Medicine

## 2017-08-17 NOTE — Telephone Encounter (Signed)
Please let the patient know that we got her bone density test back.  It reveals osteopenia.  They ran a fracture risk assessment as well to determine her risk of major osteoporotic fracture in the next 10 years.  Her risk of hip fracture is 3.7% and given that it is greater than 3% we should consider whether or not she wants to be placed on medication for treatment for osteoporosis.  If she is willing to consider treatment with medication such as Fosamax we could start her on this though we could also have her come to the office to discuss treatment further.  She should be taking calcium and vitamin D daily.  Thanks.

## 2017-08-18 ENCOUNTER — Telehealth: Payer: Self-pay | Admitting: Family Medicine

## 2017-08-18 NOTE — Telephone Encounter (Signed)
She should be getting 800 international units of vitamin D daily.  She should also be getting a total of 1200 mg of calcium daily from dietary sources and supplements.  If she needs to take a supplement to get the necessary amount of calcium she could do that.  Unfortunately her bone density test results did not come in through our system and came in as a paper copy.  We cannot send any results through my chart due to this.

## 2017-08-18 NOTE — Telephone Encounter (Signed)
Please advise 

## 2017-08-18 NOTE — Telephone Encounter (Signed)
Left message to return call, ok for pec to speak to patient, give results and schedule office visit

## 2017-08-18 NOTE — Telephone Encounter (Signed)
Pt. Returned call to receive Bone Density results.   Advised of result note from Dr. Caryl Bis from 08/17/17.  The pt. Stated she is not willing to start any prescription  medication for her osteopenia at this time.  Is willing to take Calcium and Vita. D; she questioned what dose Dr. Caryl Bis recommended.   Pt. also is requesting to have the bone density results sent through [place].  Advised will send note to Dr. Caryl Bis.  Agreed with plan.

## 2017-08-19 ENCOUNTER — Telehealth: Payer: Self-pay | Admitting: Family Medicine

## 2017-08-19 DIAGNOSIS — M8580 Other specified disorders of bone density and structure, unspecified site: Secondary | ICD-10-CM

## 2017-08-19 DIAGNOSIS — M810 Age-related osteoporosis without current pathological fracture: Secondary | ICD-10-CM

## 2017-08-19 NOTE — Telephone Encounter (Signed)
Left message to return call, ok for pec to speak to patient about message below  She should be getting 800 international units of vitamin D daily.  She should also be getting a total of 1200 mg of calcium daily from dietary sources and supplements.  If she needs to take a supplement to get the necessary amount of calcium she could do that.  Unfortunately her bone density test results did not come in through our system and came in as a paper copy.  We cannot send any results through my chart due to this.

## 2017-08-19 NOTE — Telephone Encounter (Signed)
Yes sorry fosamax

## 2017-08-19 NOTE — Telephone Encounter (Signed)
Noted. Needs to have vitamin D checked. I have placed an order. Once this is checked we can start on fosamax. Thanks.

## 2017-08-19 NOTE — Addendum Note (Signed)
Addended by: Leone Haven on: 08/19/2017 05:02 PM   Modules accepted: Orders

## 2017-08-19 NOTE — Telephone Encounter (Signed)
I suspect that this is supposed to say Fosamax though please confirm with the patient. Thanks.

## 2017-08-19 NOTE — Telephone Encounter (Signed)
Pt called back for recommendations re: Vit D and calcium supplements.  Pt states she has reconsidered and would like to start Flomax. Please call in to:   [place], [place]

## 2017-08-20 NOTE — Telephone Encounter (Signed)
See other message

## 2017-08-22 NOTE — Telephone Encounter (Signed)
Patient notified and lab scheduled for Monday.

## 2017-08-25 ENCOUNTER — Other Ambulatory Visit (INDEPENDENT_AMBULATORY_CARE_PROVIDER_SITE_OTHER): Payer: Medicare Other

## 2017-08-25 DIAGNOSIS — M810 Age-related osteoporosis without current pathological fracture: Secondary | ICD-10-CM | POA: Diagnosis not present

## 2017-08-25 LAB — VITAMIN D 25 HYDROXY (VIT D DEFICIENCY, FRACTURES): VITD: 39.71 ng/mL (ref 30.00–100.00)

## 2017-08-26 ENCOUNTER — Other Ambulatory Visit: Payer: Self-pay | Admitting: Family Medicine

## 2017-08-26 ENCOUNTER — Encounter: Payer: Self-pay | Admitting: Family Medicine

## 2017-08-26 MED ORDER — ALENDRONATE SODIUM 70 MG PO TABS: 70.0000 mg | ORAL_TABLET | ORAL | 11 refills | Status: DC

## 2017-08-28 DIAGNOSIS — L570 Actinic keratosis: Secondary | ICD-10-CM | POA: Diagnosis not present

## 2017-08-28 NOTE — Progress Notes (Signed)
Patient would like to be communicated with via mychart.

## 2017-09-05 ENCOUNTER — Ambulatory Visit (INDEPENDENT_AMBULATORY_CARE_PROVIDER_SITE_OTHER): Payer: Medicare Other | Admitting: Internal Medicine

## 2017-09-05 ENCOUNTER — Encounter: Payer: Self-pay | Admitting: Internal Medicine

## 2017-09-05 VITALS — BP 138/80 | HR 68 | Temp 97.6°F | Resp 18 | Ht 66.5 in | Wt 167.5 lb

## 2017-09-05 DIAGNOSIS — K229 Disease of esophagus, unspecified: Secondary | ICD-10-CM

## 2017-09-05 NOTE — Patient Instructions (Signed)
I referred to ENT Dr. Jess Barters ENT  Please f/u in 3 months with your PCP Dr. Chauncey Cruel

## 2017-09-05 NOTE — Progress Notes (Signed)
Chief Complaint  Patient presents with  . Acute Visit    Feels like something hanging in back of throat, felt it first a week ago when trying to swallow a pill.    Acute  C/o 1 week ago pill stuck in throat it was her Fosamax and she didn't feel like it was going down after 30 minutes so she looked in the back of her throat and saw a lesion. No dysphagia with foods/liquids no pain.     Review of Systems  HENT:       +lesion in throat   Respiratory: Negative for shortness of breath.   Cardiovascular: Negative for chest pain.  Gastrointestinal:       No dysphagia    Past Medical History:  Diagnosis Date  . Constipation   . Hemorrhoids   . Hyperlipidemia    Past Surgical History:  Procedure Laterality Date  . CHOLECYSTECTOMY  1992  . Right benign breast cyst  1997  . TONSILLECTOMY    . TUBAL LIGATION     Family History  Problem Relation Age of Onset  . Heart attack Father 91       massive  . Hyperlipidemia Father   . Stroke Mother   . Diabetes Neg Hx   . Colon cancer Neg Hx    Social History   Socioeconomic History  . Marital status: Married    Spouse name: Not on file  . Number of children: 3  . Years of education: Not on file  . Highest education level: Not on file  Occupational History  . Occupation: Retired   Scientific laboratory technician  . Financial resource strain: Not on file  . Food insecurity:    Worry: Not on file    Inability: Not on file  . Transportation needs:    Medical: Not on file    Non-medical: Not on file  Tobacco Use  . Smoking status: Never Smoker  . Smokeless tobacco: Never Used  Substance and Sexual Activity  . Alcohol use: Yes    Alcohol/week: 0.6 oz    Types: 1 Glasses of wine per week    Comment: occasionally  . Drug use: No  . Sexual activity: Yes  Lifestyle  . Physical activity:    Days per week: Not on file    Minutes per session: Not on file  . Stress: Not on file  Relationships  . Social connections:    Talks on phone: Not on  file    Gets together: Not on file    Attends religious service: Not on file    Active member of club or organization: Not on file    Attends meetings of clubs or organizations: Not on file    Relationship status: Not on file  . Intimate partner violence:    Fear of current or ex partner: Not on file    Emotionally abused: Not on file    Physically abused: Not on file    Forced sexual activity: Not on file  Other Topics Concern  . Not on file  Social History Narrative   Exercise: elliptical 4-5 days a week at [employer].    Healthy eating habits, no fried foods.   Has HCPOA: husband or daughter, Has living will. Full code (Reviewed 2015)   2 caffeine drinks daily    Current Meds  Medication Sig  . alendronate (FOSAMAX) 70 MG tablet Take 1 tablet (70 mg total) by mouth every 7 (seven) days. Take with a full glass of  water on an empty stomach.  Marland Kitchen aspirin 81 MG tablet Take 81 mg by mouth daily.  Mariane Baumgarten Sodium (COLACE PO) Take 1 tablet by mouth daily.  Marland Kitchen losartan (COZAAR) 50 MG tablet Take 1 tablet (50 mg total) by mouth daily.  . polyethylene glycol powder (GLYCOLAX/MIRALAX) powder Take 17 g by mouth 2 (two) times daily as needed.  . simvastatin (ZOCOR) 20 MG tablet Take 1 tablet (20 mg total) by mouth at bedtime.   Allergies  Allergen Reactions  . Erythromycin    Recent Results (from the past 2160 hour(s))  Basic Metabolic Panel (BMET)     Status: Abnormal   Collection Time: 06/20/17  2:02 PM  Result Value Ref Range   Sodium 137 135 - 145 mEq/L   Potassium 4.1 3.5 - 5.1 mEq/L   Chloride 101 96 - 112 mEq/L   CO2 29 19 - 32 mEq/L   Glucose, Bld 104 (H) 70 - 99 mg/dL   BUN 15 6 - 23 mg/dL   Creatinine, Ser 0.73 0.40 - 1.20 mg/dL   Calcium 9.3 8.4 - 10.5 mg/dL   GFR 83.26 >60.00 mL/min  HgB A1c     Status: None   Collection Time: 06/27/17  7:53 AM  Result Value Ref Range   Hgb A1c MFr Bld 5.9 4.6 - 6.5 %    Comment: Glycemic Control Guidelines for People with Diabetes:Non  Diabetic:  <6%Goal of Therapy: <7%Additional Action Suggested:  >8%   Lipid panel     Status: None   Collection Time: 06/27/17  7:53 AM  Result Value Ref Range   Cholesterol 176 0 - 200 mg/dL    Comment: ATP III Classification       Desirable:  < 200 mg/dL               Borderline High:  200 - 239 mg/dL          High:  > = 240 mg/dL   Triglycerides 121.0 0.0 - 149.0 mg/dL    Comment: Normal:  <150 mg/dLBorderline High:  150 - 199 mg/dL   HDL 53.00 >39.00 mg/dL   VLDL 24.2 0.0 - 40.0 mg/dL   LDL Cholesterol 99 0 - 99 mg/dL   Total CHOL/HDL Ratio 3     Comment:                Men          Women1/2 Average Risk     3.4          3.3Average Risk          5.0          4.42X Average Risk          9.6          7.13X Average Risk          15.0          11.0                       NonHDL 122.96     Comment: NOTE:  Non-HDL goal should be 30 mg/dL higher than patient's LDL goal (i.e. LDL goal of < 70 mg/dL, would have non-HDL goal of < 100 mg/dL)  Comp Met (CMET)     Status: Abnormal   Collection Time: 06/27/17  7:53 AM  Result Value Ref Range   Sodium 140 135 - 145 mEq/L   Potassium 4.5 3.5 - 5.1 mEq/L   Chloride 104  96 - 112 mEq/L   CO2 31 19 - 32 mEq/L   Glucose, Bld 105 (H) 70 - 99 mg/dL   BUN 12 6 - 23 mg/dL   Creatinine, Ser 0.75 0.40 - 1.20 mg/dL   Total Bilirubin 0.5 0.2 - 1.2 mg/dL   Alkaline Phosphatase 78 39 - 117 U/L   AST 9 0 - 37 U/L   ALT 18 0 - 35 U/L   Total Protein 7.2 6.0 - 8.3 g/dL   Albumin 4.2 3.5 - 5.2 g/dL   Calcium 9.7 8.4 - 10.5 mg/dL   GFR 80.69 >60.00 mL/min  Vitamin D (25 hydroxy)     Status: None   Collection Time: 08/25/17  2:03 PM  Result Value Ref Range   VITD 39.71 30.00 - 100.00 ng/mL   Objective  Body mass index is 26.63 kg/m. Wt Readings from Last 3 Encounters:  09/05/17 167 lb 8 oz (76 kg)  06/20/17 173 lb 6.4 oz (78.7 kg)  04/05/16 163 lb 6 oz (74.1 kg)   Temp Readings from Last 3 Encounters:  09/05/17 97.6 F (36.4 C) (Oral)  06/20/17 98.1  F (36.7 C) (Oral)  04/05/16 97.8 F (36.6 C) (Oral)   BP Readings from Last 3 Encounters:  09/05/17 138/80  06/20/17 (!) 160/90  04/05/16 124/78   Pulse Readings from Last 3 Encounters:  09/05/17 68  06/20/17 79  04/05/16 81    Physical Exam  Constitutional: She is oriented to person, place, and time and well-developed, well-nourished, and in no distress. Vital signs are normal.  HENT:  Head: Normocephalic and atraumatic.  Mouth/Throat:    Eyes: Pupils are equal, round, and reactive to light. Conjunctivae are normal.  Cardiovascular: Normal rate, regular rhythm and normal heart sounds.  Pulmonary/Chest: Effort normal and breath sounds normal.  Neurological: She is alert and oriented to person, place, and time. Gait normal. Gait normal.  Skin: Skin is warm, dry and intact.  Psychiatric: Mood, memory, affect and judgment normal.  Nursing note and vitals reviewed.   Assessment   1. 0.2-0.3 post pharyngeal/esophageal lesion ?etiology  Plan  1. Refer to ENT Dr. Tami Ribas to assess if needs further w/u   Provider: Dr. Olivia Mackie McLean-Scocuzza-Internal Medicine

## 2017-09-05 NOTE — Progress Notes (Signed)
Pre-visit discussion using our clinic review tool. No additional management support is needed unless otherwise documented below in the visit note.  

## 2017-09-09 DIAGNOSIS — J029 Acute pharyngitis, unspecified: Secondary | ICD-10-CM | POA: Diagnosis not present

## 2017-09-10 ENCOUNTER — Encounter (INDEPENDENT_AMBULATORY_CARE_PROVIDER_SITE_OTHER): Payer: Self-pay

## 2017-10-09 DIAGNOSIS — H353220 Exudative age-related macular degeneration, left eye, stage unspecified: Secondary | ICD-10-CM | POA: Diagnosis not present

## 2017-10-10 ENCOUNTER — Encounter (INDEPENDENT_AMBULATORY_CARE_PROVIDER_SITE_OTHER): Payer: Medicare Other | Admitting: Ophthalmology

## 2017-10-10 DIAGNOSIS — H353221 Exudative age-related macular degeneration, left eye, with active choroidal neovascularization: Secondary | ICD-10-CM | POA: Diagnosis not present

## 2017-10-10 DIAGNOSIS — H43813 Vitreous degeneration, bilateral: Secondary | ICD-10-CM | POA: Diagnosis not present

## 2017-10-10 DIAGNOSIS — H3562 Retinal hemorrhage, left eye: Secondary | ICD-10-CM

## 2017-10-10 DIAGNOSIS — H35033 Hypertensive retinopathy, bilateral: Secondary | ICD-10-CM | POA: Diagnosis not present

## 2017-10-10 DIAGNOSIS — H353112 Nonexudative age-related macular degeneration, right eye, intermediate dry stage: Secondary | ICD-10-CM | POA: Diagnosis not present

## 2017-10-10 DIAGNOSIS — I10 Essential (primary) hypertension: Secondary | ICD-10-CM | POA: Diagnosis not present

## 2017-11-07 ENCOUNTER — Encounter (INDEPENDENT_AMBULATORY_CARE_PROVIDER_SITE_OTHER): Payer: Medicare Other | Admitting: Ophthalmology

## 2017-11-07 DIAGNOSIS — H353221 Exudative age-related macular degeneration, left eye, with active choroidal neovascularization: Secondary | ICD-10-CM

## 2017-11-07 DIAGNOSIS — I10 Essential (primary) hypertension: Secondary | ICD-10-CM | POA: Diagnosis not present

## 2017-11-07 DIAGNOSIS — H43813 Vitreous degeneration, bilateral: Secondary | ICD-10-CM

## 2017-11-07 DIAGNOSIS — H35033 Hypertensive retinopathy, bilateral: Secondary | ICD-10-CM | POA: Diagnosis not present

## 2017-11-07 DIAGNOSIS — H353112 Nonexudative age-related macular degeneration, right eye, intermediate dry stage: Secondary | ICD-10-CM | POA: Diagnosis not present

## 2017-12-05 ENCOUNTER — Encounter (INDEPENDENT_AMBULATORY_CARE_PROVIDER_SITE_OTHER): Payer: Medicare Other | Admitting: Ophthalmology

## 2017-12-05 DIAGNOSIS — H353112 Nonexudative age-related macular degeneration, right eye, intermediate dry stage: Secondary | ICD-10-CM | POA: Diagnosis not present

## 2017-12-05 DIAGNOSIS — I10 Essential (primary) hypertension: Secondary | ICD-10-CM | POA: Diagnosis not present

## 2017-12-05 DIAGNOSIS — H43813 Vitreous degeneration, bilateral: Secondary | ICD-10-CM | POA: Diagnosis not present

## 2017-12-05 DIAGNOSIS — H353221 Exudative age-related macular degeneration, left eye, with active choroidal neovascularization: Secondary | ICD-10-CM

## 2017-12-05 DIAGNOSIS — H35033 Hypertensive retinopathy, bilateral: Secondary | ICD-10-CM | POA: Diagnosis not present

## 2018-01-02 ENCOUNTER — Encounter (INDEPENDENT_AMBULATORY_CARE_PROVIDER_SITE_OTHER): Payer: Medicare Other | Admitting: Ophthalmology

## 2018-01-02 DIAGNOSIS — H43813 Vitreous degeneration, bilateral: Secondary | ICD-10-CM | POA: Diagnosis not present

## 2018-01-02 DIAGNOSIS — H353112 Nonexudative age-related macular degeneration, right eye, intermediate dry stage: Secondary | ICD-10-CM | POA: Diagnosis not present

## 2018-01-02 DIAGNOSIS — H35033 Hypertensive retinopathy, bilateral: Secondary | ICD-10-CM | POA: Diagnosis not present

## 2018-01-02 DIAGNOSIS — H353221 Exudative age-related macular degeneration, left eye, with active choroidal neovascularization: Secondary | ICD-10-CM

## 2018-01-02 DIAGNOSIS — I10 Essential (primary) hypertension: Secondary | ICD-10-CM | POA: Diagnosis not present

## 2018-02-02 ENCOUNTER — Encounter (INDEPENDENT_AMBULATORY_CARE_PROVIDER_SITE_OTHER): Payer: Medicare Other | Admitting: Ophthalmology

## 2018-02-02 DIAGNOSIS — H353221 Exudative age-related macular degeneration, left eye, with active choroidal neovascularization: Secondary | ICD-10-CM

## 2018-02-02 DIAGNOSIS — H353112 Nonexudative age-related macular degeneration, right eye, intermediate dry stage: Secondary | ICD-10-CM | POA: Diagnosis not present

## 2018-02-02 DIAGNOSIS — H43813 Vitreous degeneration, bilateral: Secondary | ICD-10-CM

## 2018-02-02 DIAGNOSIS — H35033 Hypertensive retinopathy, bilateral: Secondary | ICD-10-CM

## 2018-02-02 DIAGNOSIS — I10 Essential (primary) hypertension: Secondary | ICD-10-CM | POA: Diagnosis not present

## 2018-03-09 ENCOUNTER — Encounter (INDEPENDENT_AMBULATORY_CARE_PROVIDER_SITE_OTHER): Payer: Medicare Other | Admitting: Ophthalmology

## 2018-03-09 DIAGNOSIS — H353112 Nonexudative age-related macular degeneration, right eye, intermediate dry stage: Secondary | ICD-10-CM | POA: Diagnosis not present

## 2018-03-09 DIAGNOSIS — H43813 Vitreous degeneration, bilateral: Secondary | ICD-10-CM | POA: Diagnosis not present

## 2018-03-09 DIAGNOSIS — H353221 Exudative age-related macular degeneration, left eye, with active choroidal neovascularization: Secondary | ICD-10-CM

## 2018-03-09 DIAGNOSIS — I10 Essential (primary) hypertension: Secondary | ICD-10-CM

## 2018-03-09 DIAGNOSIS — H35033 Hypertensive retinopathy, bilateral: Secondary | ICD-10-CM

## 2018-03-17 ENCOUNTER — Other Ambulatory Visit: Payer: Self-pay | Admitting: Family Medicine

## 2018-04-06 ENCOUNTER — Encounter (INDEPENDENT_AMBULATORY_CARE_PROVIDER_SITE_OTHER): Payer: Medicare Other | Admitting: Ophthalmology

## 2018-04-06 DIAGNOSIS — H353221 Exudative age-related macular degeneration, left eye, with active choroidal neovascularization: Secondary | ICD-10-CM

## 2018-04-06 DIAGNOSIS — H353112 Nonexudative age-related macular degeneration, right eye, intermediate dry stage: Secondary | ICD-10-CM | POA: Diagnosis not present

## 2018-04-06 DIAGNOSIS — I10 Essential (primary) hypertension: Secondary | ICD-10-CM

## 2018-04-06 DIAGNOSIS — H43813 Vitreous degeneration, bilateral: Secondary | ICD-10-CM | POA: Diagnosis not present

## 2018-04-06 DIAGNOSIS — H26491 Other secondary cataract, right eye: Secondary | ICD-10-CM | POA: Diagnosis not present

## 2018-04-06 DIAGNOSIS — H35033 Hypertensive retinopathy, bilateral: Secondary | ICD-10-CM

## 2018-04-20 ENCOUNTER — Encounter (INDEPENDENT_AMBULATORY_CARE_PROVIDER_SITE_OTHER): Payer: Medicare Other | Admitting: Ophthalmology

## 2018-04-20 ENCOUNTER — Encounter: Payer: Self-pay | Admitting: Family Medicine

## 2018-04-20 ENCOUNTER — Ambulatory Visit (INDEPENDENT_AMBULATORY_CARE_PROVIDER_SITE_OTHER): Payer: Medicare Other | Admitting: Family Medicine

## 2018-04-20 VITALS — BP 128/68 | HR 83 | Temp 97.7°F | Ht 67.0 in | Wt 174.2 lb

## 2018-04-20 DIAGNOSIS — M62830 Muscle spasm of back: Secondary | ICD-10-CM | POA: Diagnosis not present

## 2018-04-20 DIAGNOSIS — H26491 Other secondary cataract, right eye: Secondary | ICD-10-CM | POA: Diagnosis not present

## 2018-04-20 DIAGNOSIS — M545 Low back pain, unspecified: Secondary | ICD-10-CM

## 2018-04-20 MED ORDER — TIZANIDINE HCL 2 MG PO TABS: 2.0000 mg | ORAL_TABLET | Freq: Four times a day (QID) | ORAL | 0 refills | Status: DC | PRN

## 2018-04-20 MED ORDER — METHYLPREDNISOLONE 4 MG PO TBPK: ORAL_TABLET | ORAL | 0 refills | Status: DC

## 2018-04-20 NOTE — Patient Instructions (Signed)

## 2018-04-20 NOTE — Progress Notes (Signed)
Subjective:    Patient ID: Natasha Mendoza, female    DOB: January 23, 1946, 72 y.o.   MRN: [id number]  HPI  Presents to clinic c/o low back pain for 3 days.  Patient states she is crouched down to unload things into the refrigerator, when she stood back up she felt a twinge in low back.  Patient states she is been using a Tylenol, and alternating ice and heat with some effect in helping pain.  Patient states soon as she goes a few hours without using heating pad, pain will return.  Denies any numbness in legs.  Denies any saddle anesthesia.  Denies any loss of bowel or bladder control.  No fever or chills. Patient avoids NSAIDs due to already taking aspirin.  Patient Active Problem List   Diagnosis Date Noted  . Essential hypertension 06/20/2017  . Overweight 06/20/2017  . Skin lesion 06/20/2017  . DOE (dyspnea on exertion) 04/05/2016  . Hot flashes 04/05/2016  . Osteopenia 01/04/2011  . Constipation 08/30/2008  . Hyperlipidemia 05/26/2007   Social History   Tobacco Use  . Smoking status: Never Smoker  . Smokeless tobacco: Never Used  Substance Use Topics  . Alcohol use: Yes    Alcohol/week: 1.0 standard drinks    Types: 1 Glasses of wine per week    Comment: occasionally    Review of Systems  Constitutional: Negative for chills, fatigue and fever.  HENT: Negative for congestion, ear pain, sinus pain and sore throat.   Eyes: Negative.   Respiratory: Negative for cough, shortness of breath and wheezing.   Cardiovascular: Negative for chest pain, palpitations and leg swelling.  Gastrointestinal: Negative for abdominal pain, diarrhea, nausea and vomiting.  Genitourinary: Negative for dysuria, frequency and urgency.  Musculoskeletal: +low back pain Skin: Negative for color change, pallor and rash.  Neurological: Negative for syncope, light-headedness and headaches.  Psychiatric/Behavioral: The patient is not nervous/anxious.       Objective:   Physical Exam    Constitutional: She is oriented to person, place, and time.  Eyes: Conjunctivae and EOM are normal. No scleral icterus.  Neck: Neck supple. No tracheal deviation present.  Cardiovascular: Normal rate and regular rhythm.  Pulmonary/Chest: Effort normal and breath sounds normal. No respiratory distress.  Musculoskeletal: She exhibits no edema or deformity.       Lumbar back: She exhibits tenderness and spasm. She exhibits no deformity.       Back:  Bilateral pain in paraspinal muscles of lumbar spine. Straight leg raises with right & left legs cause pulling pain in low back.   Neurological: She is alert and oriented to person, place, and time. No cranial nerve deficit or sensory deficit. She exhibits normal muscle tone.  Skin: Skin is warm and dry. No erythema. No pallor.  Psychiatric: She has a normal mood and affect. Her behavior is normal.  Nursing note and vitals reviewed.     Vitals:   04/20/18 1308  BP: 128/68  Pulse: 83  Temp: 97.7 F (36.5 C)  SpO2: 97%   Assessment & Plan:   Low back pain, muscle spasm and low back- patient will take Medrol taper and also to tizanidine 2 mg as needed for muscle spasm.  Discussed continuing to use heating pad as this can be helpful to soothe muscles and reduce pain.  Patient also advised she may use Tylenol as needed as well for pain control.  Patient given handout outlining low back stretching exercises to help stretch out muscles and improve  back pain.  Patient advised that if the back pain becomes worse rather than better, she develops numbness in lower extremities, has loss of bowel or bladder control these are alarm symptoms and she is to call office right away and/or go to ER for evaluation.  Keep regularly scheduled follow-up as planned with PCP.  Return to clinic sooner if any issues arise.

## 2018-04-28 ENCOUNTER — Other Ambulatory Visit: Payer: Self-pay

## 2018-05-04 ENCOUNTER — Encounter (INDEPENDENT_AMBULATORY_CARE_PROVIDER_SITE_OTHER): Payer: Medicare Other | Admitting: Ophthalmology

## 2018-05-04 DIAGNOSIS — H353221 Exudative age-related macular degeneration, left eye, with active choroidal neovascularization: Secondary | ICD-10-CM

## 2018-05-04 DIAGNOSIS — H353112 Nonexudative age-related macular degeneration, right eye, intermediate dry stage: Secondary | ICD-10-CM | POA: Diagnosis not present

## 2018-05-04 DIAGNOSIS — H43813 Vitreous degeneration, bilateral: Secondary | ICD-10-CM

## 2018-05-04 DIAGNOSIS — I10 Essential (primary) hypertension: Secondary | ICD-10-CM | POA: Diagnosis not present

## 2018-05-04 DIAGNOSIS — H35033 Hypertensive retinopathy, bilateral: Secondary | ICD-10-CM

## 2018-05-11 ENCOUNTER — Ambulatory Visit (INDEPENDENT_AMBULATORY_CARE_PROVIDER_SITE_OTHER): Payer: Medicare Other | Admitting: Family Medicine

## 2018-05-11 ENCOUNTER — Ambulatory Visit (INDEPENDENT_AMBULATORY_CARE_PROVIDER_SITE_OTHER): Payer: Medicare Other

## 2018-05-11 VITALS — BP 138/76 | HR 85 | Temp 97.8°F | Ht 67.0 in | Wt 176.4 lb

## 2018-05-11 DIAGNOSIS — M545 Low back pain, unspecified: Secondary | ICD-10-CM

## 2018-05-11 DIAGNOSIS — M5136 Other intervertebral disc degeneration, lumbar region: Secondary | ICD-10-CM | POA: Diagnosis not present

## 2018-05-11 NOTE — Progress Notes (Signed)
Subjective:    Patient ID: Natasha Mendoza, female    DOB: 1946-01-19, 72 y.o.   MRN: [id number]  HPI   Presents to clinic for low back pain follow up.  About a month ago patient was seen for bilateral low back pain, and muscle spasm.  He was treated with steroid taper and also tizanidine.  Patient states the pain did improve somewhat, but never fully went away.  She continues to have pain on left low back, notices it mainly when twisting and turning towards the left side and stooping down.  States when she stoops down she needs to pull herself up, but in the past she was able to do this without pulling herself up.  States she will put heat on the low back with a heating pad and this does seem to help pain.  Denies any numbness or tingling in her lower extremities, denies saddle anesthesia, denies loss of bowel or bladder control.  Patient Active Problem List   Diagnosis Date Noted  . Essential hypertension 06/20/2017  . Overweight 06/20/2017  . Skin lesion 06/20/2017  . DOE (dyspnea on exertion) 04/05/2016  . Hot flashes 04/05/2016  . Osteopenia 01/04/2011  . Constipation 08/30/2008  . Hyperlipidemia 05/26/2007   Social History   Tobacco Use  . Smoking status: Never Smoker  . Smokeless tobacco: Never Used  Substance Use Topics  . Alcohol use: Yes    Alcohol/week: 1.0 standard drinks    Types: 1 Glasses of wine per week    Comment: occasionally    Review of Systems  Constitutional: Negative for chills, fatigue and fever.  HENT: Negative for congestion, ear pain, sinus pain and sore throat.   Eyes: Negative.   Respiratory: Negative for cough, shortness of breath and wheezing.   Cardiovascular: Negative for chest pain, p alpitations and leg swelling.  Gastrointestinal: Negative for abdominal pain, diarrhea, nausea and vomiting.  Genitourinary: Negative for dysuria, frequency and urgency.  Musculoskeletal: +low back pain  Skin: Negative for color change, pallor and  rash.  Neurological: Negative for syncope, light-headedness and headaches.  Psychiatric/Behavioral: The patient is not nervous/anxious.       Objective:   Physical Exam  Constitutional: She is oriented to person, place, and time. No distress.  HENT:  Head: Normocephalic and atraumatic.  Eyes: Conjunctivae and EOM are normal. No scleral icterus.  Neck: Neck supple. No tracheal deviation present.  Cardiovascular: Normal rate and regular rhythm.  Pulmonary/Chest: Effort normal and breath sounds normal. No respiratory distress.  Musculoskeletal: She exhibits no edema.       Back:  Area and read indicates location of low back pain.  Pain is reproducible with touch.  When patient twisted and leans to the left side she does have some low back pain.  Quadricep strength equal and strong.  Dorsi plantar strength equal and strong.  Gait is normal.  Neurological: She is alert and oriented to person, place, and time. No cranial nerve deficit.  Skin: Skin is warm and dry.  Psychiatric: She has a normal mood and affect. Her behavior is normal.  Nursing note and vitals reviewed.     Vitals:   05/11/18 1307  BP: 138/76  Pulse: 85  Temp: 97.8 F (36.6 C)  SpO2: 93%   Assessment & Plan:   Acute left-sided low back pain - we will get x-ray of lumbar spine and sacrum/coccyx due to back pain continuing process.  Depending on what the x-rays show, we discussed different options of  possibly seeing physical therapy.  Patient advised she can use Tylenol or ibuprofen as needed for pain as well as continue use heating pad.  Also advised to continue doing back stretches as discussed and outlined with last visit.  Results of low back x-ray will determine next plan of care.  Patient will keep regularly scheduled follow-up with PCP as planned.

## 2018-05-15 DIAGNOSIS — M9905 Segmental and somatic dysfunction of pelvic region: Secondary | ICD-10-CM | POA: Diagnosis not present

## 2018-05-15 DIAGNOSIS — M5417 Radiculopathy, lumbosacral region: Secondary | ICD-10-CM | POA: Diagnosis not present

## 2018-05-15 DIAGNOSIS — M9903 Segmental and somatic dysfunction of lumbar region: Secondary | ICD-10-CM | POA: Diagnosis not present

## 2018-05-15 DIAGNOSIS — M4306 Spondylolysis, lumbar region: Secondary | ICD-10-CM | POA: Diagnosis not present

## 2018-05-18 DIAGNOSIS — M4306 Spondylolysis, lumbar region: Secondary | ICD-10-CM | POA: Diagnosis not present

## 2018-05-18 DIAGNOSIS — M5417 Radiculopathy, lumbosacral region: Secondary | ICD-10-CM | POA: Diagnosis not present

## 2018-05-18 DIAGNOSIS — M9903 Segmental and somatic dysfunction of lumbar region: Secondary | ICD-10-CM | POA: Diagnosis not present

## 2018-05-18 DIAGNOSIS — M9905 Segmental and somatic dysfunction of pelvic region: Secondary | ICD-10-CM | POA: Diagnosis not present

## 2018-05-20 DIAGNOSIS — M5417 Radiculopathy, lumbosacral region: Secondary | ICD-10-CM | POA: Diagnosis not present

## 2018-05-20 DIAGNOSIS — M9903 Segmental and somatic dysfunction of lumbar region: Secondary | ICD-10-CM | POA: Diagnosis not present

## 2018-05-20 DIAGNOSIS — M4306 Spondylolysis, lumbar region: Secondary | ICD-10-CM | POA: Diagnosis not present

## 2018-05-20 DIAGNOSIS — M9905 Segmental and somatic dysfunction of pelvic region: Secondary | ICD-10-CM | POA: Diagnosis not present

## 2018-05-22 DIAGNOSIS — M9905 Segmental and somatic dysfunction of pelvic region: Secondary | ICD-10-CM | POA: Diagnosis not present

## 2018-05-22 DIAGNOSIS — M9903 Segmental and somatic dysfunction of lumbar region: Secondary | ICD-10-CM | POA: Diagnosis not present

## 2018-05-22 DIAGNOSIS — M4306 Spondylolysis, lumbar region: Secondary | ICD-10-CM | POA: Diagnosis not present

## 2018-05-22 DIAGNOSIS — M5417 Radiculopathy, lumbosacral region: Secondary | ICD-10-CM | POA: Diagnosis not present

## 2018-05-25 DIAGNOSIS — M4306 Spondylolysis, lumbar region: Secondary | ICD-10-CM | POA: Diagnosis not present

## 2018-05-25 DIAGNOSIS — M9903 Segmental and somatic dysfunction of lumbar region: Secondary | ICD-10-CM | POA: Diagnosis not present

## 2018-05-25 DIAGNOSIS — M9905 Segmental and somatic dysfunction of pelvic region: Secondary | ICD-10-CM | POA: Diagnosis not present

## 2018-05-25 DIAGNOSIS — M5417 Radiculopathy, lumbosacral region: Secondary | ICD-10-CM | POA: Diagnosis not present

## 2018-05-27 DIAGNOSIS — M9903 Segmental and somatic dysfunction of lumbar region: Secondary | ICD-10-CM | POA: Diagnosis not present

## 2018-05-27 DIAGNOSIS — M5417 Radiculopathy, lumbosacral region: Secondary | ICD-10-CM | POA: Diagnosis not present

## 2018-05-27 DIAGNOSIS — M9905 Segmental and somatic dysfunction of pelvic region: Secondary | ICD-10-CM | POA: Diagnosis not present

## 2018-05-27 DIAGNOSIS — M4306 Spondylolysis, lumbar region: Secondary | ICD-10-CM | POA: Diagnosis not present

## 2018-05-29 DIAGNOSIS — M5417 Radiculopathy, lumbosacral region: Secondary | ICD-10-CM | POA: Diagnosis not present

## 2018-05-29 DIAGNOSIS — M9905 Segmental and somatic dysfunction of pelvic region: Secondary | ICD-10-CM | POA: Diagnosis not present

## 2018-05-29 DIAGNOSIS — M4306 Spondylolysis, lumbar region: Secondary | ICD-10-CM | POA: Diagnosis not present

## 2018-05-29 DIAGNOSIS — M9903 Segmental and somatic dysfunction of lumbar region: Secondary | ICD-10-CM | POA: Diagnosis not present

## 2018-06-01 ENCOUNTER — Encounter (INDEPENDENT_AMBULATORY_CARE_PROVIDER_SITE_OTHER): Payer: Medicare Other | Admitting: Ophthalmology

## 2018-06-01 DIAGNOSIS — I10 Essential (primary) hypertension: Secondary | ICD-10-CM

## 2018-06-01 DIAGNOSIS — H43813 Vitreous degeneration, bilateral: Secondary | ICD-10-CM | POA: Diagnosis not present

## 2018-06-01 DIAGNOSIS — H353231 Exudative age-related macular degeneration, bilateral, with active choroidal neovascularization: Secondary | ICD-10-CM | POA: Diagnosis not present

## 2018-06-01 DIAGNOSIS — M9905 Segmental and somatic dysfunction of pelvic region: Secondary | ICD-10-CM | POA: Diagnosis not present

## 2018-06-01 DIAGNOSIS — M4306 Spondylolysis, lumbar region: Secondary | ICD-10-CM | POA: Diagnosis not present

## 2018-06-01 DIAGNOSIS — M9903 Segmental and somatic dysfunction of lumbar region: Secondary | ICD-10-CM | POA: Diagnosis not present

## 2018-06-01 DIAGNOSIS — M5417 Radiculopathy, lumbosacral region: Secondary | ICD-10-CM | POA: Diagnosis not present

## 2018-06-01 DIAGNOSIS — H35033 Hypertensive retinopathy, bilateral: Secondary | ICD-10-CM | POA: Diagnosis not present

## 2018-06-05 DIAGNOSIS — M4306 Spondylolysis, lumbar region: Secondary | ICD-10-CM | POA: Diagnosis not present

## 2018-06-05 DIAGNOSIS — M9903 Segmental and somatic dysfunction of lumbar region: Secondary | ICD-10-CM | POA: Diagnosis not present

## 2018-06-05 DIAGNOSIS — M9905 Segmental and somatic dysfunction of pelvic region: Secondary | ICD-10-CM | POA: Diagnosis not present

## 2018-06-05 DIAGNOSIS — M5417 Radiculopathy, lumbosacral region: Secondary | ICD-10-CM | POA: Diagnosis not present

## 2018-06-09 ENCOUNTER — Other Ambulatory Visit: Payer: Self-pay | Admitting: Family Medicine

## 2018-06-23 ENCOUNTER — Telehealth: Payer: Self-pay | Admitting: Family Medicine

## 2018-06-23 DIAGNOSIS — E785 Hyperlipidemia, unspecified: Secondary | ICD-10-CM

## 2018-06-23 DIAGNOSIS — R7303 Prediabetes: Secondary | ICD-10-CM

## 2018-06-23 DIAGNOSIS — M8580 Other specified disorders of bone density and structure, unspecified site: Secondary | ICD-10-CM

## 2018-06-23 NOTE — Telephone Encounter (Signed)
Sent to PCP will schedule pt for labs once orders are in thanks

## 2018-06-23 NOTE — Telephone Encounter (Signed)
Orders placed.  I will forward to Natasha Mendoza to contact the patient regarding the type of appointment she will have.  She has Medicare and thus cannot have a "physical."  They will not pay for a physical.  We can do a follow-up and make sure everything is up-to-date.

## 2018-06-23 NOTE — Telephone Encounter (Signed)
Pt would like to have fasting labs done prior to her Physical appt. Please and Thank you!

## 2018-06-25 ENCOUNTER — Other Ambulatory Visit (INDEPENDENT_AMBULATORY_CARE_PROVIDER_SITE_OTHER): Payer: Medicare Other

## 2018-06-25 DIAGNOSIS — E785 Hyperlipidemia, unspecified: Secondary | ICD-10-CM

## 2018-06-25 DIAGNOSIS — R7303 Prediabetes: Secondary | ICD-10-CM

## 2018-06-25 LAB — COMPREHENSIVE METABOLIC PANEL
ALT: 29 U/L (ref 0–35)
AST: 11 U/L (ref 0–37)
Albumin: 4.3 g/dL (ref 3.5–5.2)
Alkaline Phosphatase: 92 U/L (ref 39–117)
BUN: 11 mg/dL (ref 6–23)
CO2: 30 mEq/L (ref 19–32)
Calcium: 9.3 mg/dL (ref 8.4–10.5)
Chloride: 105 mEq/L (ref 96–112)
Creatinine, Ser: 0.77 mg/dL (ref 0.40–1.20)
GFR: 78.06 mL/min (ref >60.00)
Glucose, Bld: 104 mg/dL — ABNORMAL HIGH (ref 70–99)
Potassium: 4.8 mEq/L (ref 3.5–5.1)
Sodium: 140 mEq/L (ref 135–145)
Total Bilirubin: 0.5 mg/dL (ref 0.2–1.2)
Total Protein: 7.5 g/dL (ref 6.0–8.3)

## 2018-06-25 LAB — LIPID PANEL
Cholesterol: 163 mg/dL (ref 0–200)
HDL: 53.5 mg/dL (ref >39.00)
LDL Cholesterol: 80 mg/dL (ref 0–99)
NonHDL: 109.34
Total CHOL/HDL Ratio: 3
Triglycerides: 148 mg/dL (ref 0.0–149.0)
VLDL: 29.6 mg/dL (ref 0.0–40.0)

## 2018-06-25 LAB — HEMOGLOBIN A1C: Hgb A1c MFr Bld: 5.9 % (ref 4.6–6.5)

## 2018-06-25 NOTE — Telephone Encounter (Signed)
Called patient and left a VM to call back to schedule appt. CRM created and sent to [facility] pool.

## 2018-06-29 ENCOUNTER — Encounter (INDEPENDENT_AMBULATORY_CARE_PROVIDER_SITE_OTHER): Payer: Medicare Other | Admitting: Ophthalmology

## 2018-06-29 DIAGNOSIS — H353231 Exudative age-related macular degeneration, bilateral, with active choroidal neovascularization: Secondary | ICD-10-CM | POA: Diagnosis not present

## 2018-06-29 DIAGNOSIS — H43813 Vitreous degeneration, bilateral: Secondary | ICD-10-CM

## 2018-06-29 DIAGNOSIS — H35033 Hypertensive retinopathy, bilateral: Secondary | ICD-10-CM | POA: Diagnosis not present

## 2018-06-29 DIAGNOSIS — I10 Essential (primary) hypertension: Secondary | ICD-10-CM | POA: Diagnosis not present

## 2018-06-29 NOTE — Telephone Encounter (Signed)
Pt has already had her labs drawn

## 2018-06-29 NOTE — Telephone Encounter (Signed)
Pt has been scheduled for physical

## 2018-07-01 ENCOUNTER — Ambulatory Visit (INDEPENDENT_AMBULATORY_CARE_PROVIDER_SITE_OTHER): Payer: Medicare Other | Admitting: Family Medicine

## 2018-07-01 ENCOUNTER — Encounter: Payer: Self-pay | Admitting: Family Medicine

## 2018-07-01 DIAGNOSIS — R7303 Prediabetes: Secondary | ICD-10-CM

## 2018-07-01 DIAGNOSIS — H353230 Exudative age-related macular degeneration, bilateral, stage unspecified: Secondary | ICD-10-CM | POA: Diagnosis not present

## 2018-07-01 DIAGNOSIS — E663 Overweight: Secondary | ICD-10-CM

## 2018-07-01 DIAGNOSIS — H3530 Unspecified macular degeneration: Secondary | ICD-10-CM | POA: Insufficient documentation

## 2018-07-01 DIAGNOSIS — M8580 Other specified disorders of bone density and structure, unspecified site: Secondary | ICD-10-CM

## 2018-07-01 DIAGNOSIS — I10 Essential (primary) hypertension: Secondary | ICD-10-CM

## 2018-07-01 NOTE — Assessment & Plan Note (Signed)
Continue Fosamax given elevated FRAX score.  Continue vitamin D and dietary calcium intake.

## 2018-07-01 NOTE — Assessment & Plan Note (Signed)
Encouraged diet and exercise.  

## 2018-07-01 NOTE — Patient Instructions (Addendum)
Nice to see you. Please add in some weight lifting to help with weight loss. Please continue your healthy diet.  Please have your mammogram completed.  Please check your BP daily for 1-2 weeks and send [place] the readings.

## 2018-07-01 NOTE — Assessment & Plan Note (Signed)
Borderline blood pressure today.  She will start checking at home and contact us in several weeks with readings.  She will continue her current medication.

## 2018-07-01 NOTE — Assessment & Plan Note (Signed)
Discussed continuing healthy diet and adding in some weight training as part of her exercise regimen.

## 2018-07-01 NOTE — Progress Notes (Signed)
Tommi Rumps, MD Phone: [phone]  Natasha Mendoza is a 73 y.o. female who presents today for f/u.  CC: Macular degeneration, hypertension, osteopenia, overweight  Macular degeneration: Recently diagnosed with this.  She is following with retinal specialist.  She is getting injections.  She sees them every 4 weeks.  Hypertension: Typically [id number] systolically.  Taking losartan.  No chest pain or shortness of breath.  No edema.  Osteopenia: Taking vitamin D.  Taking Fosamax.  She is getting calcium through her diet.  No broken bones.  No issues swallowing.  She did see ENT for a possible issue in the back of her throat though she reports this was scar tissue.  Overweight: She eats very healthy.  She goes the gym several times a week.  She does some weightlifting though nothing extensive.  Patient's cologuard is up-to-date.  She has a mammogram scheduled for next month.  She declines flu vaccination.  Tetanus vaccinations up-to-date.  No family history of colon cancer or ovarian cancer.  Her paternal aunt had breast cancer.  No vaginal bleeding.  Social History   Tobacco Use  Smoking Status Never Smoker  Smokeless Tobacco Never Used     ROS see history of present illness  Objective  Physical Exam Vitals:   07/01/18 1133  BP: 140/88  Pulse: 91  Temp: 98 F (36.7 C)  SpO2: 96%    BP Readings from Last 3 Encounters:  07/01/18 140/88  05/11/18 138/76  04/20/18 128/68   Wt Readings from Last 3 Encounters:  07/01/18 173 lb 9.6 oz (78.7 kg)  05/11/18 176 lb 6.4 oz (80 kg)  04/20/18 174 lb 3.2 oz (79 kg)    Physical Exam Constitutional:      General: She is not in acute distress.    Appearance: She is not diaphoretic.  HENT:     Head: Normocephalic and atraumatic.     Mouth/Throat:     Mouth: Mucous membranes are moist.     Pharynx: Oropharynx is clear.  Eyes:     Conjunctiva/sclera: Conjunctivae normal.     Pupils: Pupils are equal, round, and reactive  to light.  Cardiovascular:     Rate and Rhythm: Normal rate and regular rhythm.     Heart sounds: Normal heart sounds.  Pulmonary:     Effort: Pulmonary effort is normal.     Breath sounds: Normal breath sounds.  Abdominal:     General: Bowel sounds are normal. There is no distension.     Palpations: Abdomen is soft.     Tenderness: There is no abdominal tenderness. There is no guarding or rebound.  Lymphadenopathy:     Cervical: No cervical adenopathy.  Skin:    General: Skin is warm and dry.  Neurological:     Mental Status: She is alert.      Assessment/Plan: Please see individual problem list.  Essential hypertension Borderline blood pressure today.  She will start checking at home and contact us in several weeks with readings.  She will continue her current medication.  Prediabetes Encouraged diet and exercise.  Osteopenia Continue Fosamax given elevated FRAX score.  Continue vitamin D and dietary calcium intake.  Macular degeneration of both eyes She will continue to see ophthalmology.  Overweight Discussed continuing healthy diet and adding in some weight training as part of her exercise regimen.   Health Maintenance: Patient will complete her mammogram as scheduled.  No orders of the defined types were placed in this encounter.   No orders  of the defined types were placed in this encounter.    Tommi Rumps, MD [place]

## 2018-07-01 NOTE — Assessment & Plan Note (Signed)
She will continue to see ophthalmology. 

## 2018-07-27 ENCOUNTER — Encounter (INDEPENDENT_AMBULATORY_CARE_PROVIDER_SITE_OTHER): Payer: Medicare Other | Admitting: Ophthalmology

## 2018-07-29 ENCOUNTER — Encounter (INDEPENDENT_AMBULATORY_CARE_PROVIDER_SITE_OTHER): Payer: Medicare Other | Admitting: Ophthalmology

## 2018-07-29 DIAGNOSIS — H353231 Exudative age-related macular degeneration, bilateral, with active choroidal neovascularization: Secondary | ICD-10-CM | POA: Diagnosis not present

## 2018-07-29 DIAGNOSIS — H35033 Hypertensive retinopathy, bilateral: Secondary | ICD-10-CM

## 2018-07-29 DIAGNOSIS — I10 Essential (primary) hypertension: Secondary | ICD-10-CM | POA: Diagnosis not present

## 2018-07-29 DIAGNOSIS — H43813 Vitreous degeneration, bilateral: Secondary | ICD-10-CM

## 2018-07-29 LAB — HM MAMMOGRAPHY

## 2018-08-04 ENCOUNTER — Other Ambulatory Visit: Payer: Self-pay | Admitting: Family Medicine

## 2018-08-04 DIAGNOSIS — Z803 Family history of malignant neoplasm of breast: Secondary | ICD-10-CM | POA: Diagnosis not present

## 2018-08-04 DIAGNOSIS — Z1231 Encounter for screening mammogram for malignant neoplasm of breast: Secondary | ICD-10-CM | POA: Diagnosis not present

## 2018-08-06 ENCOUNTER — Encounter: Payer: Self-pay | Admitting: Cardiovascular Disease

## 2018-08-06 NOTE — Telephone Encounter (Signed)
This encounter was created in error - please disregard.

## 2018-08-06 NOTE — Telephone Encounter (Signed)
Call was placed to the patient. She stated that she did not call this office and has not had a CT completed anytime recently. Upon further investigation, the patient has not been seen in the office therefore nothing was needed at this time.

## 2018-08-06 NOTE — Telephone Encounter (Signed)
Pt states her PCP ordered a CT of her chest and would like for Dr. Fletcher Anon to take a look, states she has a aortic aneurysm

## 2018-08-19 ENCOUNTER — Encounter: Payer: Self-pay | Admitting: Family Medicine

## 2018-08-19 NOTE — Telephone Encounter (Signed)
Sent to PCP to review and advise  

## 2018-08-26 ENCOUNTER — Encounter (INDEPENDENT_AMBULATORY_CARE_PROVIDER_SITE_OTHER): Payer: Medicare Other | Admitting: Ophthalmology

## 2018-08-26 ENCOUNTER — Other Ambulatory Visit: Payer: Self-pay

## 2018-08-26 DIAGNOSIS — H353231 Exudative age-related macular degeneration, bilateral, with active choroidal neovascularization: Secondary | ICD-10-CM

## 2018-08-26 DIAGNOSIS — I10 Essential (primary) hypertension: Secondary | ICD-10-CM | POA: Diagnosis not present

## 2018-08-26 DIAGNOSIS — H35033 Hypertensive retinopathy, bilateral: Secondary | ICD-10-CM | POA: Diagnosis not present

## 2018-08-26 DIAGNOSIS — H43813 Vitreous degeneration, bilateral: Secondary | ICD-10-CM | POA: Diagnosis not present

## 2018-09-24 ENCOUNTER — Other Ambulatory Visit: Payer: Self-pay

## 2018-09-24 ENCOUNTER — Encounter (INDEPENDENT_AMBULATORY_CARE_PROVIDER_SITE_OTHER): Payer: Medicare Other | Admitting: Ophthalmology

## 2018-09-24 DIAGNOSIS — H353231 Exudative age-related macular degeneration, bilateral, with active choroidal neovascularization: Secondary | ICD-10-CM | POA: Diagnosis not present

## 2018-09-24 DIAGNOSIS — I10 Essential (primary) hypertension: Secondary | ICD-10-CM | POA: Diagnosis not present

## 2018-09-24 DIAGNOSIS — H35033 Hypertensive retinopathy, bilateral: Secondary | ICD-10-CM

## 2018-09-24 DIAGNOSIS — H43813 Vitreous degeneration, bilateral: Secondary | ICD-10-CM

## 2018-10-22 ENCOUNTER — Encounter (INDEPENDENT_AMBULATORY_CARE_PROVIDER_SITE_OTHER): Payer: Medicare Other | Admitting: Ophthalmology

## 2018-10-22 ENCOUNTER — Other Ambulatory Visit: Payer: Self-pay

## 2018-10-22 DIAGNOSIS — I10 Essential (primary) hypertension: Secondary | ICD-10-CM

## 2018-10-22 DIAGNOSIS — H353231 Exudative age-related macular degeneration, bilateral, with active choroidal neovascularization: Secondary | ICD-10-CM | POA: Diagnosis not present

## 2018-10-22 DIAGNOSIS — H35033 Hypertensive retinopathy, bilateral: Secondary | ICD-10-CM | POA: Diagnosis not present

## 2018-10-22 DIAGNOSIS — H43813 Vitreous degeneration, bilateral: Secondary | ICD-10-CM | POA: Diagnosis not present

## 2018-11-19 ENCOUNTER — Other Ambulatory Visit: Payer: Self-pay

## 2018-11-19 ENCOUNTER — Encounter (INDEPENDENT_AMBULATORY_CARE_PROVIDER_SITE_OTHER): Payer: Medicare Other | Admitting: Ophthalmology

## 2018-11-19 DIAGNOSIS — H35033 Hypertensive retinopathy, bilateral: Secondary | ICD-10-CM

## 2018-11-19 DIAGNOSIS — H353231 Exudative age-related macular degeneration, bilateral, with active choroidal neovascularization: Secondary | ICD-10-CM

## 2018-11-19 DIAGNOSIS — H43813 Vitreous degeneration, bilateral: Secondary | ICD-10-CM | POA: Diagnosis not present

## 2018-11-19 DIAGNOSIS — I10 Essential (primary) hypertension: Secondary | ICD-10-CM | POA: Diagnosis not present

## 2018-12-09 ENCOUNTER — Emergency Department
Admission: EM | Admit: 2018-12-09 | Discharge: 2018-12-09 | Disposition: A | Discharge status: Home / Self Care | Payer: Medicare Other | Attending: Emergency Medicine | Admitting: Emergency Medicine

## 2018-12-09 ENCOUNTER — Other Ambulatory Visit: Payer: Self-pay

## 2018-12-09 DIAGNOSIS — E785 Hyperlipidemia, unspecified: Secondary | ICD-10-CM | POA: Diagnosis not present

## 2018-12-09 DIAGNOSIS — Z7982 Long term (current) use of aspirin: Secondary | ICD-10-CM | POA: Diagnosis not present

## 2018-12-09 DIAGNOSIS — Z79899 Other long term (current) drug therapy: Secondary | ICD-10-CM | POA: Diagnosis not present

## 2018-12-09 DIAGNOSIS — R55 Syncope and collapse: Secondary | ICD-10-CM

## 2018-12-09 DIAGNOSIS — R42 Dizziness and giddiness: Secondary | ICD-10-CM | POA: Diagnosis not present

## 2018-12-09 DIAGNOSIS — I10 Essential (primary) hypertension: Secondary | ICD-10-CM | POA: Insufficient documentation

## 2018-12-09 DIAGNOSIS — R0902 Hypoxemia: Secondary | ICD-10-CM | POA: Diagnosis not present

## 2018-12-09 DIAGNOSIS — E860 Dehydration: Secondary | ICD-10-CM | POA: Diagnosis not present

## 2018-12-09 DIAGNOSIS — Z791 Long term (current) use of non-steroidal anti-inflammatories (NSAID): Secondary | ICD-10-CM | POA: Insufficient documentation

## 2018-12-09 DIAGNOSIS — R61 Generalized hyperhidrosis: Secondary | ICD-10-CM | POA: Diagnosis not present

## 2018-12-09 DIAGNOSIS — R110 Nausea: Secondary | ICD-10-CM | POA: Diagnosis not present

## 2018-12-09 LAB — BASIC METABOLIC PANEL
Anion gap: 7 (ref 5–15)
BUN: 17 mg/dL (ref 8–23)
CO2: 24 mmol/L (ref 22–32)
Calcium: 8.9 mg/dL (ref 8.9–10.3)
Chloride: 108 mmol/L (ref 98–111)
Creatinine, Ser: 0.76 mg/dL (ref 0.44–1.00)
GFR calc Af Amer: >60 mL/min (ref >60)
GFR calc non Af Amer: >60 mL/min (ref >60)
Glucose, Bld: 129 mg/dL — ABNORMAL HIGH (ref 70–99)
Potassium: 3.6 mmol/L (ref 3.5–5.1)
Sodium: 139 mmol/L (ref 135–145)

## 2018-12-09 LAB — CBC WITH DIFFERENTIAL/PLATELET
Abs Immature Granulocytes: 0.03 10*3/uL (ref 0.00–0.07)
Basophils Absolute: 0.1 10*3/uL (ref 0.0–0.1)
Basophils Relative: 1 %
Eosinophils Absolute: 0.3 10*3/uL (ref 0.0–0.5)
Eosinophils Relative: 3 %
HCT: 40.9 % (ref 36.0–46.0)
Hemoglobin: 13.3 g/dL (ref 12.0–15.0)
Immature Granulocytes: 0 %
Lymphocytes Relative: 20 %
Lymphs Abs: 1.8 10*3/uL (ref 0.7–4.0)
MCH: 29.6 pg (ref 26.0–34.0)
MCHC: 32.5 g/dL (ref 30.0–36.0)
MCV: 91.1 fL (ref 80.0–100.0)
Monocytes Absolute: 0.9 10*3/uL (ref 0.1–1.0)
Monocytes Relative: 10 %
Neutro Abs: 6.2 10*3/uL (ref 1.7–7.7)
Neutrophils Relative %: 66 %
Platelets: 268 10*3/uL (ref 150–400)
RBC: 4.49 MIL/uL (ref 3.87–5.11)
RDW: 12.4 % (ref 11.5–15.5)
WBC: 9.3 10*3/uL (ref 4.0–10.5)
nRBC: 0 % (ref 0.0–0.2)

## 2018-12-09 MED ORDER — SODIUM CHLORIDE 0.9 % IV BOLUS
1000.0000 mL | Freq: Once | INTRAVENOUS | Status: AC
  Administered 2018-12-09: 1000 mL via INTRAVENOUS

## 2018-12-09 NOTE — ED Notes (Addendum)
Pt ambulated in hallway with steady gait, no c/o dizziness, Dr. Ellender Hose informed

## 2018-12-09 NOTE — ED Notes (Signed)
Lavender and light green top tubes sent to lab 

## 2018-12-09 NOTE — ED Provider Notes (Addendum)
[facility] Emergency Department Provider Note  ____________________________________________   First MD Initiated Contact with Patient 12/09/18 1159     (approximate)  I have reviewed the triage vital signs and the nursing notes.   HISTORY  Chief Complaint Loss of Consciousness    HPI Natasha Mendoza is a 73 y.o. female  With no significant PMHx here with syncopal episode. Pt states that she was in her usual state of health this AM. She went to help her granddaughter clear her property after eating breakfast and taking her blood pressure medications. She states that she was outside, in the heat, cleaning brush in the yard. She was bending down then up frequently and began to notice mild lightheadedness and "seeing spots" when she would stand up after bending down. She continued working and this continued and worsened, always associated with standing up. She felt progressively weaker and had to sit down on a nearby tractor, after which she tried to stand up and reportedly lost consciousness for "under a minute." She is adamant she had no CP, SOB, palpitations, or other complaints. She felt like her vision "tunneled" in and she felt lightheaded just prior to passing out, though she does not believe she totally syncopized. Denies any HA, focal numbness or weakness. She was well this AM but admits to not eating/drinking much while working outside today.    Past Medical History:  Diagnosis Date  . Constipation   . Hemorrhoids   . Hyperlipidemia     Patient Active Problem List   Diagnosis Date Noted  . Macular degeneration of both eyes 07/01/2018  . Essential hypertension 06/20/2017  . Overweight 06/20/2017  . Skin lesion 06/20/2017  . DOE (dyspnea on exertion) 04/05/2016  . Hot flashes 04/05/2016  . Osteopenia 01/04/2011  . Constipation 08/30/2008  . Prediabetes 07/19/2008  . Hyperlipidemia 05/26/2007    Past Surgical History:  Procedure Laterality  Date  . CHOLECYSTECTOMY  1992  . Right benign breast cyst  1997  . TONSILLECTOMY    . TUBAL LIGATION      Prior to Admission medications   Medication Sig Start Date End Date Taking? Authorizing Provider  alendronate (FOSAMAX) 70 MG tablet TAKE 1 TABLET EVERY 7 DAYS WITH A FULL GLASS OF WATER ON AN EMPTY STOMACH DO NOT LIE DOWN FOR AT LEAST 30 MIN 08/04/18  Yes Leone Haven, MD  aspirin 81 MG tablet Take 81 mg by mouth daily.   Yes [provider]  cholecalciferol (VITAMIN D3) 25 MCG (1000 UT) tablet Take 2,000 Units by mouth daily.   Yes [provider]  Docusate Sodium (COLACE PO) Take 1 tablet by mouth daily.   Yes [provider]  losartan (COZAAR) 50 MG tablet TAKE ONE TABLET BY MOUTH EVERY DAY 06/11/18  Yes Einar Pheasant, MD  Multiple Vitamins-Minerals (PRESERVISION AREDS 2) CAPS Take 1 tablet by mouth 2 (two) times a day.    Yes [provider]  polyethylene glycol powder (GLYCOLAX/MIRALAX) powder Take 17 g by mouth 2 (two) times daily as needed. 06/20/17  Yes Leone Haven, MD  simvastatin (ZOCOR) 20 MG tablet TAKE ONE TABLET AT BEDTIME 03/18/18  Yes Leone Haven, MD    Allergies Erythromycin  Family History  Problem Relation Age of Onset  . Heart attack Father 48       massive  . Hyperlipidemia Father   . Stroke Mother   . Diabetes Neg Hx   . Colon cancer Neg Hx  Social History Social History   Tobacco Use  . Smoking status: Never Smoker  . Smokeless tobacco: Never Used  Substance Use Topics  . Alcohol use: Yes    Alcohol/week: 1.0 standard drinks    Types: 1 Glasses of wine per week    Comment: occasionally  . Drug use: No    Review of Systems  Review of Systems  Constitutional: Positive for fatigue. Negative for fever.  HENT: Negative for congestion and sore throat.   Eyes: Negative for visual disturbance.  Respiratory: Negative for cough and shortness of breath.   Cardiovascular: Negative for chest  pain.  Gastrointestinal: Negative for abdominal pain, diarrhea, nausea and vomiting.  Genitourinary: Negative for flank pain.  Musculoskeletal: Negative for back pain and neck pain.  Skin: Negative for rash and wound.  Neurological: Positive for syncope, weakness and light-headedness.  All other systems reviewed and are negative.    ____________________________________________  PHYSICAL EXAM:      VITAL SIGNS: ED Triage Vitals  Enc Vitals Group     BP 12/09/18 1151 120/75     Pulse Rate 12/09/18 1151 74     Resp 12/09/18 1151 12     Temp 12/09/18 1151 98 F (36.7 C)     Temp Source 12/09/18 1151 Oral     SpO2 12/09/18 1151 94 %     Weight --      Height --      Head Circumference --      Peak Flow --      Pain Score 12/09/18 1152 0     Pain Loc --      Pain Edu? --      Excl. in [place]? --      Physical Exam Vitals signs and nursing note reviewed.  Constitutional:      General: She is not in acute distress.    Appearance: She is well-developed.  HENT:     Head: Normocephalic and atraumatic.     Mouth/Throat:     Mouth: Mucous membranes are dry.  Eyes:     Conjunctiva/sclera: Conjunctivae normal.  Neck:     Musculoskeletal: Neck supple.  Cardiovascular:     Rate and Rhythm: Normal rate and regular rhythm.     Heart sounds: Normal heart sounds. No murmur. No friction rub.  Pulmonary:     Effort: Pulmonary effort is normal. No respiratory distress.     Breath sounds: Normal breath sounds. No wheezing or rales.  Abdominal:     General: There is no distension.     Palpations: Abdomen is soft.     Tenderness: There is no abdominal tenderness.  Skin:    General: Skin is warm.     Capillary Refill: Capillary refill takes less than 2 seconds.  Neurological:     Mental Status: She is alert and oriented to person, place, and time.     Motor: No abnormal muscle tone.       ____________________________________________   LABS (all labs ordered are listed, but only  abnormal results are displayed)  Labs Reviewed  BASIC METABOLIC PANEL - Abnormal; Notable for the following components:      Result Value   Glucose, Bld 129 (*)    All other components within normal limits  CBC WITH DIFFERENTIAL/PLATELET    ____________________________________________  EKG: Normal sinus rhythm, VR 76. QRS 79, QTc 451. No acute ischemic changes. Normal EKG.  ___________________________________  RADIOLOGY All imaging, including plain films, CT scans, and ultrasounds, independently reviewed by  me, and interpretations confirmed via formal radiology reads.  ED MD interpretation:   None  Official radiology report(s): No results found.  ____________________________________________  PROCEDURES   Procedure(s) performed (including Critical Care):  Procedures  ____________________________________________  INITIAL IMPRESSION / MDM / [place] / ED COURSE  As part of my medical decision making, I reviewed the following data within the electronic MEDICAL RECORD NUMBER Notes from prior ED visits and [place] Controlled Substance Database      *Natasha Mendoza was evaluated in Emergency Department on 12/09/2018 for the symptoms described in the history of present illness. She was evaluated in the context of the global COVID-19 pandemic, which necessitated consideration that the patient might be at risk for infection with the SARS-CoV-2 virus that causes COVID-19. Institutional protocols and algorithms that pertain to the evaluation of patients at risk for COVID-19 are in a state of rapid change based on information released by regulatory bodies including the CDC and federal and state organizations. These policies and algorithms were followed during the patient's care in the ED.  Some ED evaluations and interventions may be delayed as a result of limited staffing during the pandemic.*      Medical Decision Making: 73 yo F here with suspected orthostatic syncope.  History, exam is consistent with decreased venous return in setting of mild dehydration and peripheral vasodilation from working outside. She is adamant she had no CP and EKG is non-ischemic, with no signs to suggest ectopy or arrhythmia. There was no seizure like activity and she is at her neuro baseline, doubt CVA or seizure. Labs/lytes are acceptable and wnl. She feels markedly improved once in cooler environment and s/p fluids and is ambulating w/o difficulty, requesting d/c. Will encourage hydration, d/c home.  ____________________________________________  FINAL CLINICAL IMPRESSION(S) / ED DIAGNOSES  Final diagnoses:  Syncope and collapse  Dehydration     MEDICATIONS GIVEN DURING THIS VISIT:  Medications  sodium chloride 0.9 % bolus 1,000 mL (0 mLs Intravenous Stopped 12/09/18 1334)     ED Discharge Orders    None       Note:  This document was prepared using Dragon voice recognition software and may include unintentional dictation errors.   Duffy Bruce, MD 12/09/18 Colbert Coyer    Duffy Bruce, MD 01/01/19 2328

## 2018-12-09 NOTE — ED Notes (Signed)
Wife updated husband on her status and plan

## 2018-12-09 NOTE — ED Triage Notes (Signed)
Pt arrives via [facility] for syncope after working in the yard around 10:45am. Pt reports she started to feel dizzy so she sat down on the lawn mower and then passed out but did not hit her head. PT reports she did lose consciousness (per family she was unconscious for less than 1 min). Pt reports she is feeling a little lightheaded now but is feeling much better. Pt A&Ox4.

## 2018-12-17 ENCOUNTER — Other Ambulatory Visit: Payer: Self-pay

## 2018-12-17 ENCOUNTER — Encounter (INDEPENDENT_AMBULATORY_CARE_PROVIDER_SITE_OTHER): Payer: Medicare Other | Admitting: Ophthalmology

## 2018-12-17 DIAGNOSIS — H353231 Exudative age-related macular degeneration, bilateral, with active choroidal neovascularization: Secondary | ICD-10-CM | POA: Diagnosis not present

## 2018-12-17 DIAGNOSIS — H43813 Vitreous degeneration, bilateral: Secondary | ICD-10-CM | POA: Diagnosis not present

## 2018-12-17 DIAGNOSIS — I10 Essential (primary) hypertension: Secondary | ICD-10-CM | POA: Diagnosis not present

## 2018-12-17 DIAGNOSIS — H35033 Hypertensive retinopathy, bilateral: Secondary | ICD-10-CM | POA: Diagnosis not present

## 2019-01-14 ENCOUNTER — Encounter (INDEPENDENT_AMBULATORY_CARE_PROVIDER_SITE_OTHER): Payer: Medicare Other | Admitting: Ophthalmology

## 2019-01-14 ENCOUNTER — Other Ambulatory Visit: Payer: Self-pay

## 2019-01-14 DIAGNOSIS — H353231 Exudative age-related macular degeneration, bilateral, with active choroidal neovascularization: Secondary | ICD-10-CM

## 2019-01-14 DIAGNOSIS — I10 Essential (primary) hypertension: Secondary | ICD-10-CM | POA: Diagnosis not present

## 2019-01-14 DIAGNOSIS — H43813 Vitreous degeneration, bilateral: Secondary | ICD-10-CM | POA: Diagnosis not present

## 2019-01-14 DIAGNOSIS — H35033 Hypertensive retinopathy, bilateral: Secondary | ICD-10-CM | POA: Diagnosis not present

## 2019-02-11 ENCOUNTER — Other Ambulatory Visit: Payer: Self-pay

## 2019-02-11 ENCOUNTER — Encounter (INDEPENDENT_AMBULATORY_CARE_PROVIDER_SITE_OTHER): Payer: Medicare Other | Admitting: Ophthalmology

## 2019-02-11 DIAGNOSIS — H43813 Vitreous degeneration, bilateral: Secondary | ICD-10-CM | POA: Diagnosis not present

## 2019-02-11 DIAGNOSIS — I10 Essential (primary) hypertension: Secondary | ICD-10-CM

## 2019-02-11 DIAGNOSIS — H353231 Exudative age-related macular degeneration, bilateral, with active choroidal neovascularization: Secondary | ICD-10-CM

## 2019-02-11 DIAGNOSIS — H35033 Hypertensive retinopathy, bilateral: Secondary | ICD-10-CM | POA: Diagnosis not present

## 2019-03-10 ENCOUNTER — Other Ambulatory Visit: Payer: Self-pay

## 2019-03-10 ENCOUNTER — Encounter (INDEPENDENT_AMBULATORY_CARE_PROVIDER_SITE_OTHER): Payer: Medicare Other | Admitting: Ophthalmology

## 2019-04-06 ENCOUNTER — Encounter (INDEPENDENT_AMBULATORY_CARE_PROVIDER_SITE_OTHER): Payer: Medicare Other | Admitting: Ophthalmology

## 2019-04-06 ENCOUNTER — Other Ambulatory Visit: Payer: Self-pay

## 2019-04-06 DIAGNOSIS — H43813 Vitreous degeneration, bilateral: Secondary | ICD-10-CM

## 2019-04-06 DIAGNOSIS — H35033 Hypertensive retinopathy, bilateral: Secondary | ICD-10-CM | POA: Diagnosis not present

## 2019-04-06 DIAGNOSIS — H353231 Exudative age-related macular degeneration, bilateral, with active choroidal neovascularization: Secondary | ICD-10-CM | POA: Diagnosis not present

## 2019-04-06 DIAGNOSIS — I10 Essential (primary) hypertension: Secondary | ICD-10-CM | POA: Diagnosis not present

## 2019-04-12 DIAGNOSIS — Z9842 Cataract extraction status, left eye: Secondary | ICD-10-CM | POA: Diagnosis not present

## 2019-04-12 DIAGNOSIS — Z9841 Cataract extraction status, right eye: Secondary | ICD-10-CM | POA: Diagnosis not present

## 2019-04-12 DIAGNOSIS — H5213 Myopia, bilateral: Secondary | ICD-10-CM | POA: Diagnosis not present

## 2019-05-03 ENCOUNTER — Encounter (INDEPENDENT_AMBULATORY_CARE_PROVIDER_SITE_OTHER): Payer: Medicare Other | Admitting: Ophthalmology

## 2019-05-03 DIAGNOSIS — H35033 Hypertensive retinopathy, bilateral: Secondary | ICD-10-CM

## 2019-05-03 DIAGNOSIS — H43813 Vitreous degeneration, bilateral: Secondary | ICD-10-CM

## 2019-05-03 DIAGNOSIS — I10 Essential (primary) hypertension: Secondary | ICD-10-CM | POA: Diagnosis not present

## 2019-05-03 DIAGNOSIS — H353231 Exudative age-related macular degeneration, bilateral, with active choroidal neovascularization: Secondary | ICD-10-CM | POA: Diagnosis not present

## 2019-05-04 ENCOUNTER — Telehealth: Payer: Self-pay

## 2019-05-04 DIAGNOSIS — R7303 Prediabetes: Secondary | ICD-10-CM

## 2019-05-04 DIAGNOSIS — E785 Hyperlipidemia, unspecified: Secondary | ICD-10-CM

## 2019-05-04 NOTE — Telephone Encounter (Signed)
Copied from [facility] [id number]. Topic: General - Other >> May 04, 2019  2:55 PM Oneta Rack wrote: Patient was advised to call in prior to physical and request pre physical lab work prior to 07/05/2018 a

## 2019-05-05 NOTE — Telephone Encounter (Signed)
I called the patient and left a message on the voicemail informing her that I ordered her labs she would just need to call the office to schedule labs before her appointment with tthe provider.  Johnetta Sloniker,cma

## 2019-05-11 ENCOUNTER — Other Ambulatory Visit: Payer: Self-pay | Admitting: Internal Medicine

## 2019-05-11 ENCOUNTER — Other Ambulatory Visit: Payer: Self-pay | Admitting: Family Medicine

## 2019-05-31 ENCOUNTER — Encounter (INDEPENDENT_AMBULATORY_CARE_PROVIDER_SITE_OTHER): Payer: Medicare Other | Admitting: Ophthalmology

## 2019-05-31 DIAGNOSIS — H35033 Hypertensive retinopathy, bilateral: Secondary | ICD-10-CM

## 2019-05-31 DIAGNOSIS — H353231 Exudative age-related macular degeneration, bilateral, with active choroidal neovascularization: Secondary | ICD-10-CM | POA: Diagnosis not present

## 2019-05-31 DIAGNOSIS — I10 Essential (primary) hypertension: Secondary | ICD-10-CM

## 2019-05-31 DIAGNOSIS — H43813 Vitreous degeneration, bilateral: Secondary | ICD-10-CM | POA: Diagnosis not present

## 2019-06-09 ENCOUNTER — Other Ambulatory Visit: Payer: Self-pay | Admitting: Family Medicine

## 2019-06-28 ENCOUNTER — Encounter (INDEPENDENT_AMBULATORY_CARE_PROVIDER_SITE_OTHER): Payer: Medicare Other | Admitting: Ophthalmology

## 2019-06-28 DIAGNOSIS — H43813 Vitreous degeneration, bilateral: Secondary | ICD-10-CM

## 2019-06-28 DIAGNOSIS — H35033 Hypertensive retinopathy, bilateral: Secondary | ICD-10-CM | POA: Diagnosis not present

## 2019-06-28 DIAGNOSIS — H353231 Exudative age-related macular degeneration, bilateral, with active choroidal neovascularization: Secondary | ICD-10-CM | POA: Diagnosis not present

## 2019-06-28 DIAGNOSIS — I10 Essential (primary) hypertension: Secondary | ICD-10-CM | POA: Diagnosis not present

## 2019-06-30 ENCOUNTER — Telehealth: Payer: Self-pay | Admitting: Family Medicine

## 2019-06-30 ENCOUNTER — Encounter: Payer: Self-pay | Admitting: Family Medicine

## 2019-06-30 NOTE — Telephone Encounter (Signed)
Called pt to reschedule appt-pt states that she needs something done about her dizzy spells. She does not think she can wait until her appt on 08/10/2019 to be seen. Pt states she was seen at hospital for passing out. Please call pt back to advise.

## 2019-06-30 NOTE — Telephone Encounter (Signed)
I am perfectly happy to see patients in person in the afternoon!  No problem

## 2019-06-30 NOTE — Telephone Encounter (Signed)
Patient was very upset and agreed to see a provider in office, next week because Dr. Caryl Bis is out of office all next week.  She has been to hospital for passing out and is still having dizzy spells, she did not want to be seen virtual she wants to be seen in person and will do screening, I scheduled her for Monday 07/05/19 with you at 3 pm, I know you do not want to do a lot of in person appt but the patient stated she did not see how anyone could help her through video or phone, If you decide you do not want to see this patient in person please let me know and I will call her and  explain. Silviano Neuser,cma

## 2019-07-05 ENCOUNTER — Other Ambulatory Visit: Payer: Medicare Other

## 2019-07-05 ENCOUNTER — Ambulatory Visit: Payer: Medicare Other | Admitting: Internal Medicine

## 2019-07-05 ENCOUNTER — Other Ambulatory Visit (INDEPENDENT_AMBULATORY_CARE_PROVIDER_SITE_OTHER): Payer: Medicare Other

## 2019-07-05 ENCOUNTER — Other Ambulatory Visit: Payer: Self-pay

## 2019-07-05 DIAGNOSIS — R7303 Prediabetes: Secondary | ICD-10-CM | POA: Diagnosis not present

## 2019-07-05 DIAGNOSIS — E785 Hyperlipidemia, unspecified: Secondary | ICD-10-CM

## 2019-07-05 LAB — COMPREHENSIVE METABOLIC PANEL
ALT: 24 U/L (ref 0–35)
AST: 8 U/L (ref 0–37)
Albumin: 4.3 g/dL (ref 3.5–5.2)
Alkaline Phosphatase: 88 U/L (ref 39–117)
BUN: 14 mg/dL (ref 6–23)
CO2: 30 mEq/L (ref 19–32)
Calcium: 9.5 mg/dL (ref 8.4–10.5)
Chloride: 106 mEq/L (ref 96–112)
Creatinine, Ser: 0.74 mg/dL (ref 0.40–1.20)
GFR: 76.68 mL/min (ref >60.00)
Glucose, Bld: 99 mg/dL (ref 70–99)
Potassium: 5 mEq/L (ref 3.5–5.1)
Sodium: 140 mEq/L (ref 135–145)
Total Bilirubin: 0.4 mg/dL (ref 0.2–1.2)
Total Protein: 7 g/dL (ref 6.0–8.3)

## 2019-07-05 LAB — LIPID PANEL
Cholesterol: 168 mg/dL (ref 0–200)
HDL: 49.6 mg/dL (ref >39.00)
LDL Cholesterol: 91 mg/dL (ref 0–99)
NonHDL: 118.4
Total CHOL/HDL Ratio: 3
Triglycerides: 138 mg/dL (ref 0.0–149.0)
VLDL: 27.6 mg/dL (ref 0.0–40.0)

## 2019-07-05 LAB — HEMOGLOBIN A1C: Hgb A1c MFr Bld: 5.9 % (ref 4.6–6.5)

## 2019-07-06 ENCOUNTER — Encounter: Payer: Medicare Other | Admitting: Family Medicine

## 2019-07-19 ENCOUNTER — Other Ambulatory Visit: Payer: Self-pay

## 2019-07-19 ENCOUNTER — Encounter: Payer: Self-pay | Admitting: Family Medicine

## 2019-07-19 ENCOUNTER — Ambulatory Visit (INDEPENDENT_AMBULATORY_CARE_PROVIDER_SITE_OTHER): Payer: Medicare Other | Admitting: Family Medicine

## 2019-07-19 DIAGNOSIS — H353230 Exudative age-related macular degeneration, bilateral, stage unspecified: Secondary | ICD-10-CM | POA: Diagnosis not present

## 2019-07-19 DIAGNOSIS — E785 Hyperlipidemia, unspecified: Secondary | ICD-10-CM

## 2019-07-19 DIAGNOSIS — R7303 Prediabetes: Secondary | ICD-10-CM

## 2019-07-19 DIAGNOSIS — I10 Essential (primary) hypertension: Secondary | ICD-10-CM

## 2019-07-19 NOTE — Assessment & Plan Note (Signed)
Well-controlled.  Does have pretty frequent lightheadedness.  We will have her stop her losartan and monitor her blood pressure for the next week.  If it shoots up she will let us know.  She will send [place] her readings at the end of 1-1.5 weeks.  She will let us know at that time if her lightheadedness has improved.

## 2019-07-19 NOTE — Assessment & Plan Note (Signed)
She reports this is stable.  She will continue to see ophthalmology.

## 2019-07-19 NOTE — Progress Notes (Signed)
Tommi Rumps, MD Phone: [phone]  Natasha Mendoza is a 74 y.o. female who presents today for follow-up.  Hypertension: Typically 118-130s/70s.  She has been having issues with lightheadedness over the last 10 months.  At times she will feel like her hands are tingly and then she will start to feel lightheaded and have to sit down.  She gets lightheaded anytime she bends over and stands up.  She is taking her losartan.  No chest pain or shortness of breath.  She does drink 4 to 5 16 ounce glasses of water daily.  Hyperlipidemia: Taking simvastatin.  No abdominal pain or muscle aches.  Prediabetes: No polyuria.  She notes she is active frequently and exercises all the time.  Diet is relatively healthy though she does eat some carbs.  Cologuard will be due in May 2021.  Mammogram is scheduled for August 12, 2019.  She got her first Covid vaccine several days ago.  She will have her second vaccine towards the end of the month.  She does not remember when her last tetanus vaccine was.  She has not had a shingles vaccine as she notes she did not have chickenpox.  She defers flu vaccine this year.  No prior pneumonia vaccine.  Macular degeneration: Patient follows with ophthalmology for this.  It is stable.  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  She gets injections every 4 weeks.  Social History   Tobacco Use  Smoking Status Never Smoker  Smokeless Tobacco Never Used     ROS   General:  Negative for nexplained weight loss, fever Skin: Negative for new or changing mole, sore that won't heal HEENT: Negative for trouble hearing, trouble seeing, ringing in ears, mouth sores, hoarseness, change in voice, dysphagia. CV: Positive for light headedness, Negative for chest pain, dyspnea, edema, palpitations Resp: Negative for cough, dyspnea, hemoptysis GI: Negative for nausea, vomiting, diarrhea, constipation, abdominal pain, melena,  hematochezia. GU: Negative for dysuria, incontinence, urinary hesitance, hematuria, vaginal or penile discharge, polyuria, sexual difficulty, lumps in testicle or breasts MSK: Negative for muscle cramps or aches, joint pain or swelling Neuro: Negative for headaches, weakness, numbness, dizziness, passing out/fainting Psych: Negative for depression, anxiety, memory problems   Objective  Physical Exam Vitals:   07/19/19 1510  BP: 115/80  Pulse: 93  Temp: (!) 97 F (36.1 C)  SpO2: 97%   Lying blood pressure 115/70 pulse 83 Sitting blood pressure 120/70 pulse 87 Standing blood pressure 110/70 pulse 103  BP Readings from Last 3 Encounters:  07/19/19 115/80  12/09/18 124/67  07/01/18 140/88   Wt Readings from Last 3 Encounters:  07/19/19 170 lb 12.8 oz (77.5 kg)  07/01/18 173 lb 9.6 oz (78.7 kg)  05/11/18 176 lb 6.4 oz (80 kg)    Physical Exam Constitutional:      General: She is not in acute distress.    Appearance: She is not diaphoretic.  HENT:     Head: Normocephalic and atraumatic.  Eyes:     Conjunctiva/sclera: Conjunctivae normal.     Pupils: Pupils are equal, round, and reactive to light.  Cardiovascular:     Rate and Rhythm: Normal rate and regular rhythm.     Heart sounds: Normal heart sounds.     Comments: No carotid bruits Pulmonary:     Effort: Pulmonary effort is normal.     Breath sounds: Normal breath sounds.  Abdominal:     General: Bowel sounds  are normal. There is no distension.     Palpations: Abdomen is soft.     Tenderness: There is no abdominal tenderness. There is no guarding or rebound.  Musculoskeletal:     Right lower leg: No edema.     Left lower leg: No edema.  Lymphadenopathy:     Cervical: No cervical adenopathy.  Skin:    General: Skin is warm and dry.  Neurological:     Mental Status: She is alert.  Psychiatric:        Mood and Affect: Mood normal.      Assessment/Plan: Please see individual problem list.  Essential  hypertension Well-controlled.  Does have pretty frequent lightheadedness.  We will have her stop her losartan and monitor her blood pressure for the next week.  If it shoots up she will let us know.  She will send [place] her readings at the end of 1-1.5 weeks.  She will let us know at that time if her lightheadedness has improved.  Hyperlipidemia Adequately controlled.  Continue simvastatin.  Prediabetes Stable.  Encouraged continued exercise and monitoring diet.  Macular degeneration of both eyes She reports this is stable.  She will continue to see ophthalmology.   Health Maintenance: I discussed tetanus vaccine, shingles vaccine, flu vaccine, and pneumonia vaccine with the patient.  She is in the process of her COVID-19 vaccine series and discussed that she would need to delay other vaccines until she is at least 2 weeks out from her second Covid vaccine.  She will consider getting these vaccines when it is time.  Discussed that she will be due for Cologuard later this year.  She notes no family history of colon cancer.  No personal history of polyps.  No blood in her stool.  She will contact us around June 1 for Cologuard to be ordered.  She will keep her appointment for her mammogram.  No orders of the defined types were placed in this encounter.   No orders of the defined types were placed in this encounter.   This visit occurred during the SARS-CoV-2 public health emergency.  Safety protocols were in place, including screening questions prior to the visit, additional usage of staff PPE, and extensive cleaning of exam room while observing appropriate contact time as indicated for disinfecting solutions.    Tommi Rumps, MD [place]

## 2019-07-19 NOTE — Patient Instructions (Signed)
Nice to see you. Please stop your losartan.  We will see if this makes a difference with your dizziness. Please check your blood pressure daily for the next 1-1.5 weeks and send [place] your readings at the end of this.  Please also let us know if your dizziness has resolved. Please contact us around June 1 regarding your Cologuard. Please consider whether or not you want to get the tetanus vaccine, shingles vaccine, and pneumonia vaccines once you have completed your Covid vaccine.  You should not get any vaccines within 14 days of the Covid vaccine.

## 2019-07-19 NOTE — Assessment & Plan Note (Signed)
Adequately controlled.  Continue simvastatin.

## 2019-07-19 NOTE — Assessment & Plan Note (Signed)
Stable.  Encouraged continued exercise and monitoring diet.

## 2019-07-23 ENCOUNTER — Ambulatory Visit: Payer: Medicare Other

## 2019-07-28 ENCOUNTER — Encounter (INDEPENDENT_AMBULATORY_CARE_PROVIDER_SITE_OTHER): Payer: Medicare Other | Admitting: Ophthalmology

## 2019-07-28 DIAGNOSIS — H353231 Exudative age-related macular degeneration, bilateral, with active choroidal neovascularization: Secondary | ICD-10-CM | POA: Diagnosis not present

## 2019-07-28 DIAGNOSIS — I10 Essential (primary) hypertension: Secondary | ICD-10-CM | POA: Diagnosis not present

## 2019-07-28 DIAGNOSIS — H35033 Hypertensive retinopathy, bilateral: Secondary | ICD-10-CM | POA: Diagnosis not present

## 2019-07-28 DIAGNOSIS — H43813 Vitreous degeneration, bilateral: Secondary | ICD-10-CM | POA: Diagnosis not present

## 2019-08-06 ENCOUNTER — Other Ambulatory Visit: Payer: Medicare Other

## 2019-08-10 ENCOUNTER — Encounter: Payer: Medicare Other | Admitting: Family Medicine

## 2019-08-25 ENCOUNTER — Encounter (INDEPENDENT_AMBULATORY_CARE_PROVIDER_SITE_OTHER): Payer: Medicare Other | Admitting: Ophthalmology

## 2019-08-25 DIAGNOSIS — H353231 Exudative age-related macular degeneration, bilateral, with active choroidal neovascularization: Secondary | ICD-10-CM | POA: Diagnosis not present

## 2019-08-25 DIAGNOSIS — H43813 Vitreous degeneration, bilateral: Secondary | ICD-10-CM

## 2019-08-25 DIAGNOSIS — I10 Essential (primary) hypertension: Secondary | ICD-10-CM | POA: Diagnosis not present

## 2019-08-25 DIAGNOSIS — H35033 Hypertensive retinopathy, bilateral: Secondary | ICD-10-CM

## 2019-09-06 DIAGNOSIS — M85851 Other specified disorders of bone density and structure, right thigh: Secondary | ICD-10-CM | POA: Diagnosis not present

## 2019-09-06 DIAGNOSIS — M85852 Other specified disorders of bone density and structure, left thigh: Secondary | ICD-10-CM | POA: Diagnosis not present

## 2019-09-06 LAB — HM MAMMOGRAPHY

## 2019-09-06 LAB — HM DEXA SCAN

## 2019-09-21 ENCOUNTER — Encounter (INDEPENDENT_AMBULATORY_CARE_PROVIDER_SITE_OTHER): Payer: Medicare Other | Admitting: Ophthalmology

## 2019-09-21 DIAGNOSIS — H43813 Vitreous degeneration, bilateral: Secondary | ICD-10-CM

## 2019-09-21 DIAGNOSIS — H353231 Exudative age-related macular degeneration, bilateral, with active choroidal neovascularization: Secondary | ICD-10-CM | POA: Diagnosis not present

## 2019-09-21 DIAGNOSIS — I10 Essential (primary) hypertension: Secondary | ICD-10-CM | POA: Diagnosis not present

## 2019-09-21 DIAGNOSIS — H35033 Hypertensive retinopathy, bilateral: Secondary | ICD-10-CM | POA: Diagnosis not present

## 2019-10-19 ENCOUNTER — Encounter (INDEPENDENT_AMBULATORY_CARE_PROVIDER_SITE_OTHER): Payer: Medicare Other | Admitting: Ophthalmology

## 2019-10-19 ENCOUNTER — Other Ambulatory Visit: Payer: Self-pay

## 2019-10-19 DIAGNOSIS — H43813 Vitreous degeneration, bilateral: Secondary | ICD-10-CM

## 2019-10-19 DIAGNOSIS — H35033 Hypertensive retinopathy, bilateral: Secondary | ICD-10-CM | POA: Diagnosis not present

## 2019-10-19 DIAGNOSIS — H353231 Exudative age-related macular degeneration, bilateral, with active choroidal neovascularization: Secondary | ICD-10-CM

## 2019-10-19 DIAGNOSIS — I10 Essential (primary) hypertension: Secondary | ICD-10-CM | POA: Diagnosis not present

## 2019-11-16 ENCOUNTER — Encounter (INDEPENDENT_AMBULATORY_CARE_PROVIDER_SITE_OTHER): Payer: Medicare Other | Admitting: Ophthalmology

## 2019-11-16 ENCOUNTER — Other Ambulatory Visit: Payer: Self-pay

## 2019-11-16 DIAGNOSIS — H43813 Vitreous degeneration, bilateral: Secondary | ICD-10-CM

## 2019-11-16 DIAGNOSIS — H35033 Hypertensive retinopathy, bilateral: Secondary | ICD-10-CM

## 2019-11-16 DIAGNOSIS — H353231 Exudative age-related macular degeneration, bilateral, with active choroidal neovascularization: Secondary | ICD-10-CM

## 2019-11-16 DIAGNOSIS — I10 Essential (primary) hypertension: Secondary | ICD-10-CM | POA: Diagnosis not present

## 2019-12-16 ENCOUNTER — Other Ambulatory Visit: Payer: Self-pay

## 2019-12-16 ENCOUNTER — Encounter (INDEPENDENT_AMBULATORY_CARE_PROVIDER_SITE_OTHER): Payer: Medicare Other | Admitting: Ophthalmology

## 2019-12-16 DIAGNOSIS — H43813 Vitreous degeneration, bilateral: Secondary | ICD-10-CM | POA: Diagnosis not present

## 2019-12-16 DIAGNOSIS — H353231 Exudative age-related macular degeneration, bilateral, with active choroidal neovascularization: Secondary | ICD-10-CM | POA: Diagnosis not present

## 2019-12-16 DIAGNOSIS — H35033 Hypertensive retinopathy, bilateral: Secondary | ICD-10-CM

## 2019-12-16 DIAGNOSIS — I10 Essential (primary) hypertension: Secondary | ICD-10-CM | POA: Diagnosis not present

## 2020-01-18 ENCOUNTER — Other Ambulatory Visit: Payer: Self-pay

## 2020-01-18 ENCOUNTER — Encounter (INDEPENDENT_AMBULATORY_CARE_PROVIDER_SITE_OTHER): Payer: Medicare Other | Admitting: Ophthalmology

## 2020-01-18 DIAGNOSIS — H43813 Vitreous degeneration, bilateral: Secondary | ICD-10-CM

## 2020-01-18 DIAGNOSIS — I10 Essential (primary) hypertension: Secondary | ICD-10-CM

## 2020-01-18 DIAGNOSIS — H35033 Hypertensive retinopathy, bilateral: Secondary | ICD-10-CM

## 2020-01-18 DIAGNOSIS — H353231 Exudative age-related macular degeneration, bilateral, with active choroidal neovascularization: Secondary | ICD-10-CM

## 2020-02-15 ENCOUNTER — Other Ambulatory Visit: Payer: Self-pay

## 2020-02-15 ENCOUNTER — Encounter (INDEPENDENT_AMBULATORY_CARE_PROVIDER_SITE_OTHER): Payer: Medicare Other | Admitting: Ophthalmology

## 2020-02-15 DIAGNOSIS — H35033 Hypertensive retinopathy, bilateral: Secondary | ICD-10-CM

## 2020-02-15 DIAGNOSIS — I10 Essential (primary) hypertension: Secondary | ICD-10-CM | POA: Diagnosis not present

## 2020-02-15 DIAGNOSIS — H353231 Exudative age-related macular degeneration, bilateral, with active choroidal neovascularization: Secondary | ICD-10-CM

## 2020-02-15 DIAGNOSIS — H43813 Vitreous degeneration, bilateral: Secondary | ICD-10-CM

## 2020-03-05 DIAGNOSIS — Z23 Encounter for immunization: Secondary | ICD-10-CM | POA: Diagnosis not present

## 2020-03-13 ENCOUNTER — Other Ambulatory Visit: Payer: Self-pay | Admitting: Family Medicine

## 2020-03-14 ENCOUNTER — Encounter (INDEPENDENT_AMBULATORY_CARE_PROVIDER_SITE_OTHER): Payer: Medicare Other | Admitting: Ophthalmology

## 2020-03-14 ENCOUNTER — Other Ambulatory Visit: Payer: Self-pay

## 2020-03-14 DIAGNOSIS — H43813 Vitreous degeneration, bilateral: Secondary | ICD-10-CM | POA: Diagnosis not present

## 2020-03-14 DIAGNOSIS — H353231 Exudative age-related macular degeneration, bilateral, with active choroidal neovascularization: Secondary | ICD-10-CM | POA: Diagnosis not present

## 2020-03-14 DIAGNOSIS — H35033 Hypertensive retinopathy, bilateral: Secondary | ICD-10-CM

## 2020-03-14 DIAGNOSIS — I10 Essential (primary) hypertension: Secondary | ICD-10-CM

## 2020-04-11 ENCOUNTER — Other Ambulatory Visit: Payer: Self-pay

## 2020-04-11 ENCOUNTER — Encounter (INDEPENDENT_AMBULATORY_CARE_PROVIDER_SITE_OTHER): Payer: Medicare Other | Admitting: Ophthalmology

## 2020-04-11 DIAGNOSIS — H353231 Exudative age-related macular degeneration, bilateral, with active choroidal neovascularization: Secondary | ICD-10-CM

## 2020-04-11 DIAGNOSIS — H43813 Vitreous degeneration, bilateral: Secondary | ICD-10-CM | POA: Diagnosis not present

## 2020-04-11 DIAGNOSIS — H35033 Hypertensive retinopathy, bilateral: Secondary | ICD-10-CM

## 2020-04-11 DIAGNOSIS — I10 Essential (primary) hypertension: Secondary | ICD-10-CM

## 2020-04-28 ENCOUNTER — Ambulatory Visit (INDEPENDENT_AMBULATORY_CARE_PROVIDER_SITE_OTHER): Payer: Medicare Other | Admitting: Internal Medicine

## 2020-04-28 ENCOUNTER — Other Ambulatory Visit: Payer: Self-pay

## 2020-04-28 ENCOUNTER — Encounter: Payer: Self-pay | Admitting: Internal Medicine

## 2020-04-28 VITALS — BP 140/84 | HR 80 | Temp 98.0°F | Ht 67.0 in | Wt 175.2 lb

## 2020-04-28 DIAGNOSIS — N819 Female genital prolapse, unspecified: Secondary | ICD-10-CM

## 2020-04-28 DIAGNOSIS — I10 Essential (primary) hypertension: Secondary | ICD-10-CM

## 2020-04-28 DIAGNOSIS — K649 Unspecified hemorrhoids: Secondary | ICD-10-CM

## 2020-04-28 DIAGNOSIS — N8110 Cystocele, unspecified: Secondary | ICD-10-CM

## 2020-04-28 MED ORDER — LOSARTAN POTASSIUM 50 MG PO TABS: 25.0000 mg | ORAL_TABLET | Freq: Every day | ORAL | 3 refills | Status: DC

## 2020-04-28 NOTE — Patient Instructions (Signed)
Pelvic Organ Prolapse Pelvic organ prolapse is the stretching, bulging, or dropping of pelvic organs into an abnormal position. It happens when the muscles and tissues that surround and support pelvic structures become weak or stretched. Pelvic organ prolapse can involve the:  Vagina (vaginal prolapse).  Uterus (uterine prolapse).  Bladder (cystocele).  Rectum (rectocele).  Intestines (enterocele). When organs other than the vagina are involved, they often bulge into the vagina or protrude from the vagina, depending on how severe the prolapse is. What are the causes? This condition may be caused by:  Pregnancy, labor, and childbirth.  Past pelvic surgery.  Decreased production of the hormone estrogen associated with menopause.  Consistently lifting more than 50 lb (23 kg).  Obesity.  Long-term inability to pass stool (chronic constipation).  A cough that lasts a long time (chronic).  Buildup of fluid in the abdomen due to certain diseases and other conditions. What are the signs or symptoms? Symptoms of this condition include:  Passing a little urine (loss of bladder control) when you cough, sneeze, strain, and exercise (stress incontinence). This may be worse immediately after childbirth. It may gradually improve over time.  Feeling pressure in your pelvis or vagina. This pressure may increase when you cough or when you are passing stool.  A bulge that protrudes from the opening of your vagina.  Difficulty passing urine or stool.  Pain in your lower back.  Pain, discomfort, or disinterest in sex.  Repeated bladder infections (urinary tract infections).  Difficulty inserting a tampon. In some people, this condition causes no symptoms. How is this diagnosed? This condition may be diagnosed based on a vaginal and rectal exam. During the exam, you may be asked to cough and strain while you are lying down, sitting, and standing up. Your health care provider will  determine if other tests are required, such as bladder function tests. How is this treated? Treatment for this condition may depend on your symptoms. Treatment may include:  Lifestyle changes, such as changes to your diet.  Emptying your bladder at scheduled times (bladder training therapy). This can help reduce or avoid urinary incontinence.  Estrogen. Estrogen may help mild prolapse by increasing the strength and tone of pelvic floor muscles.  Kegel exercises. These may help mild cases of prolapse by strengthening and tightening the muscles of the pelvic floor.  A soft, flexible device that helps support the vaginal walls and keep pelvic organs in place (pessary). This is inserted into your vagina by your health care provider.  Surgery. This is often the only form of treatment for severe prolapse. Follow these instructions at home:  Avoid drinking beverages that contain caffeine or alcohol.  Increase your intake of high-fiber foods. This can help decrease constipation and straining during bowel movements.  Lose weight if recommended by your health care provider.  Wear a sanitary pad or adult diapers if you have urinary incontinence.  Avoid heavy lifting and straining with exercise and work. Do not hold your breath when you perform mild to moderate lifting and exercise activities. Limit your activities as directed by your health care provider.  Do Kegel exercises as directed by your health care provider. To do this: ? Squeeze your pelvic floor muscles tight. You should feel a tight lift in your rectal area and a tightness in your vaginal area. Keep your stomach, buttocks, and legs relaxed. ? Hold the muscles tight for up to 10 seconds. ? Relax your muscles. ? Repeat this exercise 50 times a day,   or as many times as told by your health care provider. Continue to do this exercise for at least 4-6 weeks, or for as long as told by your health care provider.  Take over-the-counter and  prescription medicines only as told by your health care provider.  If you have a pessary, take care of it as told by your health care provider.  Keep all follow-up visits as told by your health care provider. This is important. Contact a health care provider if you:  Have symptoms that interfere with your daily activities or sex life.  Need medicine to help with the discomfort.  Notice bleeding from your vagina that is not related to your period.  Have a fever.  Have pain or bleeding when you urinate.  Have bleeding when you pass stool.  Pass urine when you have sex.  Have chronic constipation.  Have a pessary that falls out.  Have bad smelling vaginal discharge.  Have an unusual, low pain in your abdomen. Summary  Pelvic organ prolapse is the stretching, bulging, or dropping of pelvic organs into an abnormal position. It happens when the muscles and tissues that surround and support pelvic structures become weak or stretched.  When organs other than the vagina are involved, they often bulge into the vagina or protrude from the vagina, depending on how severe the prolapse is.  In most cases, this condition needs to be treated only if it produces symptoms. Treatment may include lifestyle changes, estrogen, Kegel exercises, pessary insertion, or surgery.  Avoid heavy lifting and straining with exercise and work. Do not hold your breath when you perform mild to moderate lifting and exercise activities. Limit your activities as directed by your health care provider. This information is not intended to replace advice given to you by your health care provider. Make sure you discuss any questions you have with your health care provider. Document Revised: 06/18/2017 Document Reviewed: 06/18/2017 Elsevier Patient Education  2020 [employer]  

## 2020-04-28 NOTE — Progress Notes (Signed)
Chief Complaint  Patient presents with   Vaginal Prolapse   F/u  1. 1-2 days ago she felt something protruding from her vagina denies pain/bleeding and leakage of urine has had 3 vaginal deliveries and 1 3 month miscarriage vaginally and another miscarriage  Denies blood or discharge  2. HTN BP still elevated 140s-150s/80s     Review of Systems  Constitutional: Negative for weight loss.  HENT: Negative for hearing loss.   Eyes: Negative for blurred vision.  Respiratory: Negative for shortness of breath.   Cardiovascular: Negative for chest pain.  Gastrointestinal: Negative for abdominal pain.  Genitourinary:       +prolapse    Musculoskeletal: Negative for falls.  Skin: Negative for rash.  Neurological: Negative for headaches.  Psychiatric/Behavioral: Negative for depression.   Past Medical History:  Diagnosis Date   Constipation    Hemorrhoids    Hyperlipidemia    Past Surgical History:  Procedure Laterality Date   CHOLECYSTECTOMY  1992   Right benign breast cyst  1997   TONSILLECTOMY     TUBAL LIGATION     Family History  Problem Relation Age of Onset   Heart attack Father 10       massive   Hyperlipidemia Father    Stroke Mother    Diabetes Neg Hx    Colon cancer Neg Hx    Social History   Socioeconomic History   Marital status: Married    Spouse name: Not on file   Number of children: 3   Years of education: Not on file   Highest education level: Not on file  Occupational History   Occupation: Retired   Tobacco Use   Smoking status: Never Smoker   Smokeless tobacco: Never Used  Substance and Sexual Activity   Alcohol use: Yes    Alcohol/week: 1.0 standard drink    Types: 1 Glasses of wine per week    Comment: occasionally   Drug use: No   Sexual activity: Yes  Other Topics Concern   Not on file  Social History Narrative   Exercise: elliptical 4-5 days a week at [facility].    Healthy eating habits, no fried foods.   Has  HCPOA: husband or daughter, Has living will. Full code (Reviewed 2015)   2 caffeine drinks daily    Social Determinants of Health   Financial Resource Strain:    Difficulty of Paying Living Expenses: Not on file  Food Insecurity:    Worried About [place] in the Last Year: Not on file   Ran Out of Food in the Last Year: Not on file  Transportation Needs:    Lack of Transportation (Medical): Not on file   Lack of Transportation (Non-Medical): Not on file  Physical Activity:    Days of Exercise per Week: Not on file   Minutes of Exercise per Session: Not on file  Stress:    Feeling of Stress : Not on file  Social Connections:    Frequency of Communication with Friends and Family: Not on file   Frequency of Social Gatherings with Friends and Family: Not on file   Attends Religious Services: Not on file   Active Member of Clubs or Organizations: Not on file   Attends Archivist Meetings: Not on file   Marital Status: Not on file  Intimate Partner Violence:    Fear of Current or Ex-Partner: Not on file   Emotionally Abused: Not on file   Physically Abused: Not on  file   Sexually Abused: Not on file   Current Meds  Medication Sig   alendronate (FOSAMAX) 70 MG tablet TAKE 1 TABLET EVERY 7 DAYS WITH A FULL GLASS OF WATER ON AN EMPTY STOMACH DO NOT LIE DOWN FOR AT LEAST 30 MIN   aspirin 81 MG tablet Take 81 mg by mouth daily.   cholecalciferol (VITAMIN D3) 25 MCG (1000 UT) tablet Take 2,000 Units by mouth daily.   Docusate Sodium (COLACE PO) Take 1 tablet by mouth daily.   Multiple Vitamins-Minerals (PRESERVISION AREDS 2) CAPS Take 1 tablet by mouth 2 (two) times a day.    polyethylene glycol powder (GLYCOLAX/MIRALAX) powder Take 17 g by mouth 2 (two) times daily as needed.   simvastatin (ZOCOR) 20 MG tablet TAKE 1 TABLET BY MOUTH AT BEDTIME   Allergies  Allergen Reactions   Erythromycin    No results found for this or any previous  visit (from the past 2160 hour(s)). Objective  Body mass index is 27.44 kg/m. Wt Readings from Last 3 Encounters:  04/28/20 175 lb 3.2 oz (79.5 kg)  07/19/19 170 lb 12.8 oz (77.5 kg)  07/01/18 173 lb 9.6 oz (78.7 kg)   Temp Readings from Last 3 Encounters:  04/28/20 98 F (36.7 C) (Oral)  07/19/19 (!) 97 F (36.1 C) (Temporal)  12/09/18 98 F (36.7 C) (Oral)   BP Readings from Last 3 Encounters:  04/28/20 140/84  07/19/19 115/80  12/09/18 124/67   Pulse Readings from Last 3 Encounters:  04/28/20 80  07/19/19 93  12/09/18 78    Physical Exam Vitals and nursing note reviewed. Exam conducted with a chaperone present.  Constitutional:      Appearance: Normal appearance. She is well-developed and well-groomed.  HENT:     Head: Normocephalic and atraumatic.  Cardiovascular:     Rate and Rhythm: Normal rate and regular rhythm.  Genitourinary:    Labia:        Right: No rash.        Left: No rash.      Rectum: External hemorrhoid present.     Comments: +bladder prolapse   Skin:    General: Skin is warm and dry.  Neurological:     General: No focal deficit present.     Mental Status: She is alert and oriented to person, place, and time. Mental status is at baseline.     Gait: Gait normal.  Psychiatric:        Attention and Perception: Attention and perception normal.        Mood and Affect: Mood and affect normal.        Speech: Speech normal.        Behavior: Behavior normal. Behavior is cooperative.        Thought Content: Thought content normal.        Cognition and Memory: Cognition and memory normal.        Judgment: Judgment normal.     Assessment  Plan  Female genital prolapse, unspecified type likely bladder- Plan: Ambulatory referral to Obstetrics / Gynecology Dr. Georgianne Fick    Hypertension, unspecified type - Plan: losartan (COZAAR) 50 MG tablet will reduce to 25 mg qd and f/u with PCP  Will cut 50 mg pills in 1/2  Provider: Dr. Olivia Mackie  [facility]

## 2020-04-28 NOTE — Progress Notes (Signed)
Patient states that when wiping last night after urinating she felt something was wrong. Patient states she inserted a finger inside of herself and she felt something bulging. She states she was able ti push on it and push it all the way back inside. When doing this she felt a pain and pressure there.

## 2020-05-02 ENCOUNTER — Telehealth: Payer: Self-pay | Admitting: Family Medicine

## 2020-05-02 DIAGNOSIS — I10 Essential (primary) hypertension: Secondary | ICD-10-CM

## 2020-05-02 DIAGNOSIS — E785 Hyperlipidemia, unspecified: Secondary | ICD-10-CM

## 2020-05-02 DIAGNOSIS — R7303 Prediabetes: Secondary | ICD-10-CM

## 2020-05-02 NOTE — Telephone Encounter (Signed)
I called and scheduled labs with the patient.  Amiria Orrison,cma

## 2020-05-02 NOTE — Addendum Note (Signed)
Addended by: Fulton Mole D on: 05/02/2020 03:37 PM   Modules accepted: Orders

## 2020-05-02 NOTE — Telephone Encounter (Signed)
Pt wanted to have labs done before her CPE in Feb

## 2020-05-02 NOTE — Telephone Encounter (Signed)
Called the patient and ordered labs and scheduled with the patient.  Natasha Mendoza,cma

## 2020-05-08 ENCOUNTER — Other Ambulatory Visit: Payer: Self-pay

## 2020-05-08 ENCOUNTER — Encounter: Payer: Self-pay | Admitting: Obstetrics and Gynecology

## 2020-05-08 ENCOUNTER — Ambulatory Visit (INDEPENDENT_AMBULATORY_CARE_PROVIDER_SITE_OTHER): Payer: Medicare Other | Admitting: Obstetrics and Gynecology

## 2020-05-08 VITALS — BP 138/80 | Ht 67.0 in | Wt 175.6 lb

## 2020-05-08 DIAGNOSIS — N8110 Cystocele, unspecified: Secondary | ICD-10-CM

## 2020-05-08 NOTE — Progress Notes (Signed)
[facility] REFERRING FOR URGENT PROLAPSE BLADDER.

## 2020-05-08 NOTE — Patient Instructions (Signed)
Pelvic Organ Prolapse Pelvic organ prolapse is the stretching, bulging, or dropping of pelvic organs into an abnormal position. It happens when the muscles and tissues that surround and support pelvic structures become weak or stretched. Pelvic organ prolapse can involve the:  Vagina (vaginal prolapse).  Uterus (uterine prolapse).  Bladder (cystocele).  Rectum (rectocele).  Intestines (enterocele). When organs other than the vagina are involved, they often bulge into the vagina or protrude from the vagina, depending on how severe the prolapse is. What are the causes? This condition may be caused by:  Pregnancy, labor, and childbirth.  Past pelvic surgery.  Decreased production of the hormone estrogen associated with menopause.  Consistently lifting more than 50 lb (23 kg).  Obesity.  Long-term inability to pass stool (chronic constipation).  A cough that lasts a long time (chronic).  Buildup of fluid in the abdomen due to certain diseases and other conditions. What are the signs or symptoms? Symptoms of this condition include:  Passing a little urine (loss of bladder control) when you cough, sneeze, strain, and exercise (stress incontinence). This may be worse immediately after childbirth. It may gradually improve over time.  Feeling pressure in your pelvis or vagina. This pressure may increase when you cough or when you are passing stool.  A bulge that protrudes from the opening of your vagina.  Difficulty passing urine or stool.  Pain in your lower back.  Pain, discomfort, or disinterest in sex.  Repeated bladder infections (urinary tract infections).  Difficulty inserting a tampon. In some people, this condition causes no symptoms. How is this diagnosed? This condition may be diagnosed based on a vaginal and rectal exam. During the exam, you may be asked to cough and strain while you are lying down, sitting, and standing up. Your health care provider will  determine if other tests are required, such as bladder function tests. How is this treated? Treatment for this condition may depend on your symptoms. Treatment may include:  Lifestyle changes, such as changes to your diet.  Emptying your bladder at scheduled times (bladder training therapy). This can help reduce or avoid urinary incontinence.  Estrogen. Estrogen may help mild prolapse by increasing the strength and tone of pelvic floor muscles.  Kegel exercises. These may help mild cases of prolapse by strengthening and tightening the muscles of the pelvic floor.  A soft, flexible device that helps support the vaginal walls and keep pelvic organs in place (pessary). This is inserted into your vagina by your health care provider.  Surgery. This is often the only form of treatment for severe prolapse. Follow these instructions at home:  Avoid drinking beverages that contain caffeine or alcohol.  Increase your intake of high-fiber foods. This can help decrease constipation and straining during bowel movements.  Lose weight if recommended by your health care provider.  Wear a sanitary pad or adult diapers if you have urinary incontinence.  Avoid heavy lifting and straining with exercise and work. Do not hold your breath when you perform mild to moderate lifting and exercise activities. Limit your activities as directed by your health care provider.  Do Kegel exercises as directed by your health care provider. To do this: ? Squeeze your pelvic floor muscles tight. You should feel a tight lift in your rectal area and a tightness in your vaginal area. Keep your stomach, buttocks, and legs relaxed. ? Hold the muscles tight for up to 10 seconds. ? Relax your muscles. ? Repeat this exercise 50 times a day,   or as many times as told by your health care provider. Continue to do this exercise for at least 4-6 weeks, or for as long as told by your health care provider.  Take over-the-counter and  prescription medicines only as told by your health care provider.  If you have a pessary, take care of it as told by your health care provider.  Keep all follow-up visits as told by your health care provider. This is important. Contact a health care provider if you:  Have symptoms that interfere with your daily activities or sex life.  Need medicine to help with the discomfort.  Notice bleeding from your vagina that is not related to your period.  Have a fever.  Have pain or bleeding when you urinate.  Have bleeding when you pass stool.  Pass urine when you have sex.  Have chronic constipation.  Have a pessary that falls out.  Have bad smelling vaginal discharge.  Have an unusual, low pain in your abdomen. Summary  Pelvic organ prolapse is the stretching, bulging, or dropping of pelvic organs into an abnormal position. It happens when the muscles and tissues that surround and support pelvic structures become weak or stretched.  When organs other than the vagina are involved, they often bulge into the vagina or protrude from the vagina, depending on how severe the prolapse is.  In most cases, this condition needs to be treated only if it produces symptoms. Treatment may include lifestyle changes, estrogen, Kegel exercises, pessary insertion, or surgery.  Avoid heavy lifting and straining with exercise and work. Do not hold your breath when you perform mild to moderate lifting and exercise activities. Limit your activities as directed by your health care provider. This information is not intended to replace advice given to you by your health care provider. Make sure you discuss any questions you have with your health care provider. Document Revised: 06/18/2017 Document Reviewed: 06/18/2017 Elsevier Patient Education  2020 [place]. About Cystocele  Overview  The pelvic organs, including the bladder, are normally supported by pelvic floor muscles and ligaments.  When  these muscles and ligaments are stretched, weakened or torn, the wall between the bladder and the vagina sags or herniates causing a prolapse, sometimes called a cystocele.  This condition may cause discomfort and problems with emptying the bladder.  It can be present in various stages.  Some people are not aware of the changes.  Others may notice changes at the vaginal opening or a feeling of the bladder dropping outside the body.  Causes of a Cystocele  A cystocele is usually caused by muscle straining or stretching during childbirth.  In addition, cystocele is more common after menopause, because the hormone estrogen helps keep the elastic tissues around the pelvic organs strong.  A cystocele is more likely to occur when levels of estrogen decrease.  Other causes include: heavy lifting, chronic coughing, previous pelvic surgery and obesity.  Symptoms  A bladder that has dropped from its normal position may cause: unwanted urine leakage (stress incontinence), frequent urination or urge to urinate, incomplete emptying of the bladder (not feeling bladder relief after emptying), pain or discomfort in the vagina, pelvis, groin, lower back or lower abdomen and frequent urinary tract infections.  Mild cases may not cause any symptoms.  Treatment Options  Pelvic floor (Kegel) exercises:  Strength training the muscles in your genital area  Behavioral changes: Treating and preventing constipation, taking time to empty your bladder properly, learning to lift properly and/or  avoid heavy lifting when possible, stopping smoking, avoiding weight gain and treating a chronic cough or bronchitis.  A pessary: A vaginal support device is sometimes used to help pelvic support caused by muscle and ligament changes.  Surgery: Surgical repair may be necessary if symptoms cannot be managed with exercise, behavioral changes and a pessary.  Surgery is usually considered for severe cases.   2007, [employer]

## 2020-05-08 NOTE — Progress Notes (Signed)
Patient ID: Natasha Mendoza, female   DOB: February 13, 1946, 74 y.o.   MRN: [id number]  Reason for Consult: Gynecologic Exam   Referred by Leone Haven, MD  Subjective:     HPI:  Natasha Mendoza is a 74 y.o. female. She is here with complaints of prolapse and a vaginal bulge.  Gynecological History  No LMP recorded. Patient is postmenopausal.   History of fibroids, polyps, or ovarian cysts? : no  History of PCOS? no Hstory of Endometriosis? no History of abnormal pap smears? no Have you had any sexually transmitted infections in the past? no  Past Medical History:  Diagnosis Date  . Constipation   . Hemorrhoids   . Hyperlipidemia   . Hypertension    Family History  Problem Relation Age of Onset  . Heart attack Father 48       massive  . Hyperlipidemia Father   . Stroke Mother   . Cancer Paternal Aunt        breast  . Diabetes Neg Hx   . Colon cancer Neg Hx    Past Surgical History:  Procedure Laterality Date  . CHOLECYSTECTOMY  1992  . Right benign breast cyst  1997  . TONSILLECTOMY    . TUBAL LIGATION      Short Social History:  Social History   Tobacco Use  . Smoking status: Never Smoker  . Smokeless tobacco: Never Used  Substance Use Topics  . Alcohol use: Yes    Alcohol/week: 1.0 standard drink    Types: 1 Glasses of wine per week    Comment: occasionally    Allergies  Allergen Reactions  . Erythromycin     Current Outpatient Medications  Medication Sig Dispense Refill  . alendronate (FOSAMAX) 70 MG tablet TAKE 1 TABLET EVERY 7 DAYS WITH A FULL GLASS OF WATER ON AN EMPTY STOMACH DO NOT LIE DOWN FOR AT LEAST 30 MIN 4 tablet 11  . aspirin 81 MG tablet Take 81 mg by mouth daily.    . cholecalciferol (VITAMIN D3) 25 MCG (1000 UT) tablet Take 2,000 Units by mouth daily.    Mariane Baumgarten Sodium (COLACE PO) Take 1 tablet by mouth daily.    Marland Kitchen losartan (COZAAR) 50 MG tablet TAKE ONE TABLET EVERY DAY 90 tablet 3  . Multiple Vitamins-Minerals  (PRESERVISION AREDS 2) CAPS Take 1 tablet by mouth 2 (two) times a day.     . polyethylene glycol powder (GLYCOLAX/MIRALAX) powder Take 17 g by mouth 2 (two) times daily as needed. 850 g 1  . simvastatin (ZOCOR) 20 MG tablet TAKE 1 TABLET BY MOUTH AT BEDTIME 90 tablet 3   No current facility-administered medications for this visit.    Review of Systems  Constitutional: Negative for chills, fatigue, fever and unexpected weight change.  HENT: Negative for trouble swallowing.  Eyes: Negative for loss of vision.  Respiratory: Negative for cough, shortness of breath and wheezing.  Cardiovascular: Negative for chest pain, leg swelling, palpitations and syncope.  GI: Negative for abdominal pain, blood in stool, diarrhea, nausea and vomiting.  GU: Negative for difficulty urinating, dysuria, frequency and hematuria.  Musculoskeletal: Negative for back pain, leg pain and joint pain.  Skin: Negative for rash.  Neurological: Negative for dizziness, headaches, light-headedness, numbness and seizures.  Psychiatric: Negative for behavioral problem, confusion, depressed mood and sleep disturbance.        Objective:  Objective   Vitals:   05/08/20 1443  BP: 138/80  Weight: 175 lb 9.6  oz (79.7 kg)  Height: 5\' 7"  (1.702 m)   Body mass index is 27.5 kg/m.  Physical Exam Vitals and nursing note reviewed. Exam conducted with a chaperone present.  Constitutional:      Appearance: Normal appearance. She is well-developed.  HENT:     Head: Normocephalic and atraumatic.  Eyes:     Extraocular Movements: Extraocular movements intact.     Pupils: Pupils are equal, round, and reactive to light.  Cardiovascular:     Rate and Rhythm: Normal rate and regular rhythm.  Pulmonary:     Effort: Pulmonary effort is normal. No respiratory distress.     Breath sounds: Normal breath sounds.  Abdominal:     General: Abdomen is flat.     Palpations: Abdomen is soft.  Genitourinary:    Comments: External:  Normal appearing vulva. No lesions noted.  Speculum examination: Normal appearing cervix. No blood in the vaginal vault. No discharge.   Bimanual examination: Uterus midline, non-tender, normal in size, shape and contour.  No CMT. No adnexal masses. No adnexal tenderness. Pelvis not fixed. Stage 2 prolapse evident.  Musculoskeletal:        General: No signs of injury.  Skin:    General: Skin is warm and dry.  Neurological:     Mental Status: She is alert and oriented to person, place, and time.  Psychiatric:        Behavior: Behavior normal.        Thought Content: Thought content normal.        Judgment: Judgment normal.     Assessment/Plan:     Natasha Mendoza is a 74 y.o. [phone] with stage  prolapse We discussed how her diagnosis of prolapse and she was show illustrations of her prolapse. We discussed that prolapse is generally not life threatening but can cause difficulty with urination and bowel movements. This includes urinary retension and constipation. We discussed that if prolapse is bothersome a finger can be used to gently replace any prolapsing tissue into the vagina. We discussed treatment option for prolapse including pelvic floor physical therapy, pessary, and pelvic surgery. At this time the patient opts for expectant management of her prolapse. She will plan to follow up as needed, she would like to see urogynecology for consultation . She was provided with handouts from [facility] regarding prolapse.   More than 20 minutes were spent face to face with the patient in the room, reviewing the medical record, labs and images, and coordinating care for the patient. The plan of management was discussed in detail and counseling was provided.   Adrian Prows MD, [facility], [facility] 10/04/2020 8:09 PM

## 2020-05-09 ENCOUNTER — Encounter (INDEPENDENT_AMBULATORY_CARE_PROVIDER_SITE_OTHER): Payer: Medicare Other | Admitting: Ophthalmology

## 2020-05-09 DIAGNOSIS — H35033 Hypertensive retinopathy, bilateral: Secondary | ICD-10-CM

## 2020-05-09 DIAGNOSIS — I10 Essential (primary) hypertension: Secondary | ICD-10-CM

## 2020-05-09 DIAGNOSIS — H353231 Exudative age-related macular degeneration, bilateral, with active choroidal neovascularization: Secondary | ICD-10-CM

## 2020-05-09 DIAGNOSIS — H43813 Vitreous degeneration, bilateral: Secondary | ICD-10-CM

## 2020-05-18 DIAGNOSIS — Z9842 Cataract extraction status, left eye: Secondary | ICD-10-CM | POA: Diagnosis not present

## 2020-05-18 DIAGNOSIS — Z9841 Cataract extraction status, right eye: Secondary | ICD-10-CM | POA: Diagnosis not present

## 2020-05-18 DIAGNOSIS — H5213 Myopia, bilateral: Secondary | ICD-10-CM | POA: Diagnosis not present

## 2020-05-18 NOTE — Progress Notes (Signed)
[facility] New Patient Evaluation and Consultation  Referring Provider: Homero Fellers, * PCP: Leone Haven, MD Date of Service: 05/22/2020  SUBJECTIVE Chief Complaint: New Patient (Initial Visit) (Dr Gilman Schmidt referral for bladder prolapse)  History of Present Illness: Natasha Mendoza is a 74 y.o. White or Caucasian female seen in consultation at the request of Dr. Gilman Schmidt for evaluation of prolapse.    Review of records significant for: Patient has bladder prolapse, noted to be stage 2 on exam.   Urinary Symptoms: Does not leak urine.   Feels there has been a strong smell to her urine recently.  Day time voids every few hours (abt 3x per day).  Nocturia: 0 times per night to void. Voiding dysfunction: she empties her bladder well.  does not use a catheter to empty bladder.  When urinating, she feels she has no difficulties  UTIs: 0 UTI's in the last year.   Denies history of blood in urine and kidney or bladder stones  Pelvic Organ Prolapse Symptoms:                  She Admits to a feeling of a bulge the vaginal area. It has been present for 2 months.  She Admits to seeing a bulge.  This bulge is not bothersome. Noticed it more when she was working outside.   Bowel Symptom: Bowel movements: 1 time(s) per day Stool consistency: soft  Straining: yes.  Splinting: yes, only sometimes Incomplete evacuation: no.  She Denies accidental bowel leakage / fecal incontinence Bowel regimen: stool softener and miralax Last colonoscopy: Date 2018, Results negative  Sexual Function Sexually active: no.   Pelvic Pain Denies pelvic pain   Past Medical History:  Past Medical History:  Diagnosis Date  . Constipation   . Hemorrhoids   . Hyperlipidemia   . Hypertension      Past Surgical History:   Past Surgical History:  Procedure Laterality Date  . CHOLECYSTECTOMY  1992  . Right benign breast cyst  1997  . TONSILLECTOMY    . TUBAL LIGATION        Past OB/GYN History: G5 P3 Vaginal deliveries: 3,  Forceps/ Vacuum deliveries: 0, Cesarean section: 0 Menopausal: Yes, Denies vaginal bleeding since menopause Contraception: n/a. Last pap smear- unsure.     Medications: She has a current medication list which includes the following prescription(s): alendronate, aspirin, cholecalciferol, docusate sodium, losartan, preservision areds 2, polyethylene glycol powder, and simvastatin.   Allergies: Patient is allergic to erythromycin.   Social History:  Social History   Tobacco Use  . Smoking status: Never Smoker  . Smokeless tobacco: Never Used  Vaping Use  . Vaping Use: Never used  Substance Use Topics  . Alcohol use: Yes    Alcohol/week: 1.0 standard drink    Types: 1 Glasses of wine per week    Comment: occasionally  . Drug use: No    Relationship status: married She lives with husband.   She is not employed. Regular exercise: Yes:   History of abuse: No  Family History:   Family History  Problem Relation Age of Onset  . Heart attack Father 51       massive  . Hyperlipidemia Father   . Stroke Mother   . Cancer Paternal Aunt        breast  . Diabetes Neg Hx   . Colon cancer Neg Hx      Review of Systems: Review of Systems  Constitutional: Negative for  fever, malaise/fatigue and weight loss.  Respiratory: Negative for cough, shortness of breath and wheezing.   Cardiovascular: Negative for chest pain, palpitations and leg swelling.  Gastrointestinal: Negative for abdominal pain and blood in stool.  Genitourinary: Negative for dysuria.  Musculoskeletal: Negative for myalgias.  Skin: Negative for rash.  Neurological: Negative for dizziness and headaches.  Endo/Heme/Allergies: Does not bruise/bleed easily.       + hot flases  Psychiatric/Behavioral: Negative for depression. The patient is not nervous/anxious.      OBJECTIVE Physical Exam: Vitals:   05/22/20 0956  BP: (!) 163/87  Pulse: 77  Weight:  174 lb (78.9 kg)  Height: 5\' 7"  (1.702 m)    Physical Exam Constitutional:      General: She is not in acute distress. Pulmonary:     Effort: Pulmonary effort is normal.  Abdominal:     General: There is no distension.     Palpations: Abdomen is soft.     Tenderness: There is no abdominal tenderness. There is no rebound.  Musculoskeletal:        General: No swelling. Normal range of motion.  Skin:    General: Skin is warm and dry.     Findings: No rash.  Neurological:     Mental Status: She is alert and oriented to person, place, and time.  Psychiatric:        Mood and Affect: Mood normal.        Behavior: Behavior normal.      GU / Detailed Urogynecologic Evaluation:  Pelvic Exam: Normal external female genitalia; Bartholin's and Skene's glands normal in appearance; urethral meatus with caruncle, no urethral masses or discharge.   CST: negative   Speculum exam reveals normal vaginal mucosa with atrophy. Cervix normal appearance. Uterus normal single, nontender. Adnexa no mass, fullness, tenderness.     Pelvic floor strength I/V, puborectalis II/V external anal sphincter II/V  Pelvic floor musculature: Right levator non-tender, Right obturator non-tender, Left levator non-tender, Left obturator non-tender  POP-Q:   POP-Q  1                                            Aa   1                                           Ba  -4                                              C   4.5                                            Gh  5                                            Pb  9  tvl   -1                                            Ap  -1                                            Bp  -7                                              D     Rectal Exam:  Normal sphincter tone, small distal rectocele, enterocoele not present, no rectal masses, noted dyssynergia when asking the patient to bear down.  Post-Void Residual  (PVR) by Bladder Scan: In order to evaluate bladder emptying, we discussed obtaining a postvoid residual and she agreed to this procedure.  Procedure: The ultrasound unit was placed on the patient's abdomen in the suprapubic region after the patient had voided. A PVR of 102 ml was obtained by bladder scan.  Laboratory Results: POC urine: POC urine negative  I visualized the urine specimen, noting the specimen to be clear yellow  ASSESSMENT AND PLAN Ms. Maeder is a 74 y.o. with:  1. Prolapse of anterior vaginal wall   2. Uterovaginal prolapse, incomplete   3. Foul smelling urine   4. Rectosphincteric dyssynergia    1. Stage II anterior, Stage II posterior, Stage I apical prolapse  For treatment of pelvic organ prolapse, we discussed options for management including expectant management, conservative management, and surgical management, such as Kegels, a pessary, pelvic floor physical therapy, and specific surgical procedures. - She prefers expectant management at this time. Discussed avoiding straining and heavy lifting to prevent progression of prolapse.   2. Urine smell - POC urine negative for infection.   3. Pelvic floor dyssynergia - Discussed that she has some difficulty engaging and relaxing pelvic floor muscles, which may contribute to straining with bowel movements. She will consider pelvic floor PT for this but overall feels that her symptoms are not bothersome as long as she keeps to her bowel regimen.   Return as needed or if symptoms worsen  Jaquita Folds, MD   Medical Decision Making:  - Reviewed/ ordered a clinical laboratory test - Review and summation of prior records - Independent review of urine specimen

## 2020-05-22 ENCOUNTER — Ambulatory Visit (INDEPENDENT_AMBULATORY_CARE_PROVIDER_SITE_OTHER): Payer: Medicare Other | Admitting: Obstetrics and Gynecology

## 2020-05-22 ENCOUNTER — Encounter: Payer: Self-pay | Admitting: Obstetrics and Gynecology

## 2020-05-22 ENCOUNTER — Other Ambulatory Visit: Payer: Self-pay

## 2020-05-22 VITALS — BP 163/87 | HR 77 | Ht 67.0 in | Wt 174.0 lb

## 2020-05-22 DIAGNOSIS — R198 Other specified symptoms and signs involving the digestive system and abdomen: Secondary | ICD-10-CM

## 2020-05-22 DIAGNOSIS — R8290 Unspecified abnormal findings in urine: Secondary | ICD-10-CM

## 2020-05-22 DIAGNOSIS — N812 Incomplete uterovaginal prolapse: Secondary | ICD-10-CM | POA: Diagnosis not present

## 2020-05-22 DIAGNOSIS — N8110 Cystocele, unspecified: Secondary | ICD-10-CM | POA: Diagnosis not present

## 2020-05-22 DIAGNOSIS — N816 Rectocele: Secondary | ICD-10-CM

## 2020-05-22 LAB — POCT URINALYSIS DIPSTICK
Appearance: NORMAL
Bilirubin, UA: NEGATIVE
Blood, UA: NEGATIVE
Glucose, UA: NEGATIVE
Ketones, UA: NEGATIVE
Leukocytes, UA: NEGATIVE
Nitrite, UA: NEGATIVE
Protein, UA: NEGATIVE
Spec Grav, UA: 1.02 (ref 1.010–1.025)
Urobilinogen, UA: 0.2 E.U./dL
pH, UA: 5 (ref 5.0–8.0)

## 2020-05-22 NOTE — Patient Instructions (Signed)
You have a stage 2 (out of 4) prolapse.  We discussed the fact that it is not life threatening but there are several treatment options. For treatment of pelvic organ prolapse, we discussed options for management including expectant management, conservative management, and surgical management, such as Kegels, a pessary, pelvic floor physical therapy, and specific surgical procedures.    

## 2020-06-08 ENCOUNTER — Other Ambulatory Visit: Payer: Self-pay | Admitting: Family Medicine

## 2020-06-13 ENCOUNTER — Other Ambulatory Visit: Payer: Self-pay

## 2020-06-13 ENCOUNTER — Encounter (INDEPENDENT_AMBULATORY_CARE_PROVIDER_SITE_OTHER): Payer: Medicare Other | Admitting: Ophthalmology

## 2020-06-13 DIAGNOSIS — H353231 Exudative age-related macular degeneration, bilateral, with active choroidal neovascularization: Secondary | ICD-10-CM | POA: Diagnosis not present

## 2020-06-13 DIAGNOSIS — H35033 Hypertensive retinopathy, bilateral: Secondary | ICD-10-CM | POA: Diagnosis not present

## 2020-06-13 DIAGNOSIS — I10 Essential (primary) hypertension: Secondary | ICD-10-CM | POA: Diagnosis not present

## 2020-06-13 DIAGNOSIS — H43813 Vitreous degeneration, bilateral: Secondary | ICD-10-CM | POA: Diagnosis not present

## 2020-07-05 ENCOUNTER — Other Ambulatory Visit: Payer: Self-pay | Admitting: Family Medicine

## 2020-07-05 DIAGNOSIS — I10 Essential (primary) hypertension: Secondary | ICD-10-CM

## 2020-07-10 ENCOUNTER — Other Ambulatory Visit: Payer: Self-pay

## 2020-07-10 ENCOUNTER — Other Ambulatory Visit (INDEPENDENT_AMBULATORY_CARE_PROVIDER_SITE_OTHER): Payer: Medicare Other

## 2020-07-10 DIAGNOSIS — R7303 Prediabetes: Secondary | ICD-10-CM

## 2020-07-10 DIAGNOSIS — I10 Essential (primary) hypertension: Secondary | ICD-10-CM

## 2020-07-10 DIAGNOSIS — E785 Hyperlipidemia, unspecified: Secondary | ICD-10-CM

## 2020-07-10 LAB — COMPREHENSIVE METABOLIC PANEL
ALT: 22 U/L (ref 0–35)
AST: 7 U/L (ref 0–37)
Albumin: 4.2 g/dL (ref 3.5–5.2)
Alkaline Phosphatase: 93 U/L (ref 39–117)
BUN: 11 mg/dL (ref 6–23)
CO2: 30 mEq/L (ref 19–32)
Calcium: 9.2 mg/dL (ref 8.4–10.5)
Chloride: 105 mEq/L (ref 96–112)
Creatinine, Ser: 0.64 mg/dL (ref 0.40–1.20)
GFR: 86.58 mL/min (ref >60.00)
Glucose, Bld: 97 mg/dL (ref 70–99)
Potassium: 4.5 mEq/L (ref 3.5–5.1)
Sodium: 141 mEq/L (ref 135–145)
Total Bilirubin: 0.4 mg/dL (ref 0.2–1.2)
Total Protein: 7 g/dL (ref 6.0–8.3)

## 2020-07-10 LAB — HEMOGLOBIN A1C: Hgb A1c MFr Bld: 6.1 % (ref 4.6–6.5)

## 2020-07-11 ENCOUNTER — Encounter (INDEPENDENT_AMBULATORY_CARE_PROVIDER_SITE_OTHER): Payer: Medicare Other | Admitting: Ophthalmology

## 2020-07-11 DIAGNOSIS — I10 Essential (primary) hypertension: Secondary | ICD-10-CM | POA: Diagnosis not present

## 2020-07-11 DIAGNOSIS — H353231 Exudative age-related macular degeneration, bilateral, with active choroidal neovascularization: Secondary | ICD-10-CM

## 2020-07-11 DIAGNOSIS — H43813 Vitreous degeneration, bilateral: Secondary | ICD-10-CM

## 2020-07-11 DIAGNOSIS — H35033 Hypertensive retinopathy, bilateral: Secondary | ICD-10-CM

## 2020-07-11 LAB — LIPID PANEL WITH LDL/HDL RATIO
Cholesterol, Total: 188 mg/dL (ref 100–199)
HDL: 46 mg/dL (ref >39)
LDL Chol Calc (NIH): 104 mg/dL — ABNORMAL HIGH (ref 0–99)
LDL/HDL Ratio: 2.3 ratio (ref 0.0–3.2)
Triglycerides: 220 mg/dL — ABNORMAL HIGH (ref 0–149)
VLDL Cholesterol Cal: 38 mg/dL (ref 5–40)

## 2020-07-19 ENCOUNTER — Other Ambulatory Visit: Payer: Self-pay

## 2020-07-21 ENCOUNTER — Encounter: Payer: Self-pay | Admitting: Family Medicine

## 2020-07-21 ENCOUNTER — Ambulatory Visit (INDEPENDENT_AMBULATORY_CARE_PROVIDER_SITE_OTHER): Payer: Medicare Other | Admitting: Family Medicine

## 2020-07-21 ENCOUNTER — Other Ambulatory Visit: Payer: Self-pay

## 2020-07-21 VITALS — BP 120/80 | HR 84 | Temp 98.0°F | Ht 67.0 in | Wt 176.0 lb

## 2020-07-21 DIAGNOSIS — Z0000 Encounter for general adult medical examination without abnormal findings: Secondary | ICD-10-CM | POA: Diagnosis not present

## 2020-07-21 DIAGNOSIS — Z1211 Encounter for screening for malignant neoplasm of colon: Secondary | ICD-10-CM

## 2020-07-21 DIAGNOSIS — Z0001 Encounter for general adult medical examination with abnormal findings: Secondary | ICD-10-CM | POA: Insufficient documentation

## 2020-07-21 NOTE — Patient Instructions (Signed)
Nice to see you. Please continue with healthy diet.  Please try to increase your exercise. A good calorie goal for you to lose 1 pound a week is 1317 cal a day.  Please do not ever drop below 1200 cal/day. The Cologuard should come to your house.  If you do not receive this in the next month or so please let us know.

## 2020-07-21 NOTE — Progress Notes (Signed)
Tommi Rumps, MD Phone: [phone]  Natasha Mendoza is a 75 y.o. female who presents today for CPE.  Diet: Diet is generally healthy.  Lots of chicken and fish.  Some potatoes and pasta. Exercise: She is getting back into walking.  Has not done much exercise in the last 6 months. Pap smear: Aged out Colonoscopy: Due for Cologuard Mammogram: Up-to-date Family history-  Colon cancer: No  Breast cancer: No  Ovarian cancer: No  Thyroid cancer: No  Adrenal cancer: No  Parathyroid cancer: No  Menses: Postmenopausal Vaccines-   Flu: Declines  Tetanus: Up-to-date  Shingles: Declines  COVID19: Up-to-date  Pneumonia: Declined  Hep C Screening: Declined previously Tobacco use: Now Alcohol use: Rare Illicit Drug use: No Dentist: Has dentures Ophthalmology: Yes   Active Ambulatory Problems    Diagnosis Date Noted  . Hyperlipidemia 05/26/2007  . Constipation 08/30/2008  . Prediabetes 07/19/2008  . Osteopenia 01/04/2011  . DOE (dyspnea on exertion) 04/05/2016  . Hot flashes 04/05/2016  . Essential hypertension 06/20/2017  . Overweight 06/20/2017  . Skin lesion 06/20/2017  . Macular degeneration of both eyes 07/01/2018  . Hemorrhoids 04/28/2020  . Routine general medical examination at a health care facility 07/21/2020   Resolved Ambulatory Problems    Diagnosis Date Noted  . MENOPAUSAL SYNDROME 05/26/2007  . CONTACT DERMATITIS 12/06/2008  . Cough 08/17/2007  . URINALYSIS, ABNORMAL 05/27/2007  . Epigastric abdominal pain 03/21/2011  . Flank pain 03/21/2011  . Elevated BP 03/20/2015  . Cough 11/23/2015   Past Medical History:  Diagnosis Date  . Constipation   . Hemorrhoids   . Hypertension     Family History  Problem Relation Age of Onset  . Heart attack Father 2       massive  . Hyperlipidemia Father   . Stroke Mother   . Cancer Paternal Aunt        breast  . Diabetes Neg Hx   . Colon cancer Neg Hx     Social History   Socioeconomic History   . Marital status: Married    Spouse name: Not on file  . Number of children: 3  . Years of education: Not on file  . Highest education level: Not on file  Occupational History  . Occupation: Retired   Tobacco Use  . Smoking status: Never Smoker  . Smokeless tobacco: Never Used  Vaping Use  . Vaping Use: Never used  Substance and Sexual Activity  . Alcohol use: Yes    Alcohol/week: 1.0 standard drink    Types: 1 Glasses of wine per week    Comment: occasionally  . Drug use: No  . Sexual activity: Not Currently  Other Topics Concern  . Not on file  Social History Narrative   Exercise: elliptical 4-5 days a week at [place].    Healthy eating habits, no fried foods.   Has HCPOA: husband or daughter, Has living will. Full code (Reviewed 2015)   2 caffeine drinks daily    Social Determinants of Health   Financial Resource Strain: Not on file  Food Insecurity: Not on file  Transportation Needs: Not on file  Physical Activity: Not on file  Stress: Not on file  Social Connections: Not on file  Intimate Partner Violence: Not on file    ROS  General:  Negative for nexplained weight loss, fever Skin: Negative for new or changing mole, sore that won't heal HEENT: Negative for trouble hearing, trouble seeing, ringing in ears, mouth sores, hoarseness, change in  voice, dysphagia. CV:  Negative for chest pain, dyspnea, edema, palpitations Resp: Negative for cough, dyspnea, hemoptysis GI: Negative for nausea, vomiting, diarrhea, constipation, abdominal pain, melena, hematochezia. GU: Negative for dysuria, incontinence, urinary hesitance, hematuria, vaginal or penile discharge, polyuria, sexual difficulty, lumps in testicle or breasts MSK: Negative for muscle cramps or aches, joint pain or swelling Neuro: Negative for headaches, weakness, numbness, dizziness, passing out/fainting Psych: Negative for depression, anxiety, memory problems  Objective  Physical Exam Vitals:   07/21/20  1324  BP: 120/80  Pulse: 84  Temp: 98 F (36.7 C)  SpO2: 97%    BP Readings from Last 3 Encounters:  07/21/20 120/80  05/22/20 (!) 163/87  05/08/20 138/80   Wt Readings from Last 3 Encounters:  07/21/20 176 lb (79.8 kg)  05/22/20 174 lb (78.9 kg)  05/08/20 175 lb 9.6 oz (79.7 kg)    Physical Exam Constitutional:      General: She is not in acute distress.    Appearance: She is not diaphoretic.  HENT:     Head: Normocephalic and atraumatic.  Eyes:     Conjunctiva/sclera: Conjunctivae normal.     Pupils: Pupils are equal, round, and reactive to light.  Cardiovascular:     Rate and Rhythm: Normal rate and regular rhythm.     Heart sounds: Normal heart sounds.  Pulmonary:     Effort: Pulmonary effort is normal.     Breath sounds: Normal breath sounds.  Abdominal:     General: Bowel sounds are normal. There is no distension.     Palpations: Abdomen is soft.     Tenderness: There is no abdominal tenderness. There is no guarding or rebound.  Genitourinary:    Comments: Fulton Mole, CMA served as chaperone, bilateral breast with no skin changes, nipple inversion, masses, or tenderness, no axillary masses bilaterally Musculoskeletal:        General: No edema.     Right lower leg: No edema.     Left lower leg: No edema.  Lymphadenopathy:     Cervical: No cervical adenopathy.  Skin:    General: Skin is warm and dry.  Neurological:     Mental Status: She is alert.  Psychiatric:        Mood and Affect: Mood normal.      Assessment/Plan:   Problem List Items Addressed This Visit    Routine general medical examination at a health care facility - Primary    Physical exam completed.  Encouraged continued healthy diet.  Discussed increasing exercise.  The patient was interested in medicine for weight loss and we did discuss Ozempic briefly though the she was concerned about the potential cost.  I advised that some of the other medications would not be great for her given  her blood pressure issues.  She will work on diet and exercise.  She was given a calorie goal of 1317 cal daily to lose 1 pound weight loss per week.  She was advised not to drop below 1200 cal a day.  Lab work was reviewed with the patient.  Cologuard was ordered.  She was reminded to get her screening mammogram when it was due.       Other Visit Diagnoses    Colon cancer screening       Relevant Orders   Cologuard      This visit occurred during the SARS-CoV-2 public health emergency.  Safety protocols were in place, including screening questions prior to the visit, additional usage of staff  PPE, and extensive cleaning of exam room while observing appropriate contact time as indicated for disinfecting solutions.    Tommi Rumps, MD [place]

## 2020-07-21 NOTE — Assessment & Plan Note (Signed)
Physical exam completed.  Encouraged continued healthy diet.  Discussed increasing exercise.  The patient was interested in medicine for weight loss and we did discuss Ozempic briefly though the she was concerned about the potential cost.  I advised that some of the other medications would not be great for her given her blood pressure issues.  She will work on diet and exercise.  She was given a calorie goal of 1317 cal daily to lose 1 pound weight loss per week.  She was advised not to drop below 1200 cal a day.  Lab work was reviewed with the patient.  Cologuard was ordered.  She was reminded to get her screening mammogram when it was due.

## 2020-07-31 LAB — COLOGUARD: Cologuard: NEGATIVE

## 2020-08-07 ENCOUNTER — Encounter (INDEPENDENT_AMBULATORY_CARE_PROVIDER_SITE_OTHER): Payer: Medicare Other | Admitting: Ophthalmology

## 2020-08-07 ENCOUNTER — Other Ambulatory Visit: Payer: Self-pay

## 2020-08-07 DIAGNOSIS — I10 Essential (primary) hypertension: Secondary | ICD-10-CM

## 2020-08-07 DIAGNOSIS — H353231 Exudative age-related macular degeneration, bilateral, with active choroidal neovascularization: Secondary | ICD-10-CM

## 2020-08-07 DIAGNOSIS — H43813 Vitreous degeneration, bilateral: Secondary | ICD-10-CM | POA: Diagnosis not present

## 2020-08-07 DIAGNOSIS — H35033 Hypertensive retinopathy, bilateral: Secondary | ICD-10-CM

## 2020-09-05 ENCOUNTER — Other Ambulatory Visit: Payer: Self-pay

## 2020-09-05 ENCOUNTER — Encounter (INDEPENDENT_AMBULATORY_CARE_PROVIDER_SITE_OTHER): Payer: Medicare Other | Admitting: Ophthalmology

## 2020-09-05 DIAGNOSIS — H353231 Exudative age-related macular degeneration, bilateral, with active choroidal neovascularization: Secondary | ICD-10-CM | POA: Diagnosis not present

## 2020-09-05 DIAGNOSIS — H35033 Hypertensive retinopathy, bilateral: Secondary | ICD-10-CM | POA: Diagnosis not present

## 2020-09-05 DIAGNOSIS — I10 Essential (primary) hypertension: Secondary | ICD-10-CM | POA: Diagnosis not present

## 2020-09-05 DIAGNOSIS — H43813 Vitreous degeneration, bilateral: Secondary | ICD-10-CM | POA: Diagnosis not present

## 2020-09-07 ENCOUNTER — Ambulatory Visit (INDEPENDENT_AMBULATORY_CARE_PROVIDER_SITE_OTHER): Payer: Medicare Other

## 2020-09-07 VITALS — Ht 67.0 in | Wt 172.0 lb

## 2020-09-07 DIAGNOSIS — Z0000 Encounter for general adult medical examination without abnormal findings: Secondary | ICD-10-CM

## 2020-09-07 NOTE — Patient Instructions (Addendum)
Natasha Mendoza , Thank you for taking time to come for your Medicare Wellness Visit. I appreciate your ongoing commitment to your health goals. Please review the following plan we discussed and let me know if I can assist you in the future.   These are the goals we discussed: Goals    . Follow up with Primary Care Provider     As needed       This is a list of the screening recommended for you and due dates:  Health Maintenance  Topic Date Due  . COVID-19 Vaccine (2 - Pfizer 3-dose series) 08/06/2019  . Flu Shot  09/07/2020*  . Tetanus Vaccine  12/17/2020  . Cologuard (Stool DNA test)  08/01/2023  . DEXA scan (bone density measurement)  Completed  . HPV Vaccine  Aged Out  *Topic was postponed. The date shown is not the original due date.    Immunizations Immunization History  Administered Date(s) Administered  . PFIZER(Purple Top)SARS-COV-2 Vaccination 07/16/2019    Advanced directives: End of life planning; Advance aging; Advanced directives discussed.  Copy of current HCPOA/Living Will requested.    Conditions/risks identified: none new.   Follow up in one year for your annual wellness visit.   Preventive Care 14 Years and Older, Female Preventive care refers to lifestyle choices and visits with your health care provider that can promote health and wellness. What does preventive care include?  A yearly physical exam. This is also called an annual well check.  Dental exams once or twice a year.  Routine eye exams. Ask your health care provider how often you should have your eyes checked.  Personal lifestyle choices, including:  Daily care of your teeth and gums.  Regular physical activity.  Eating a healthy diet.  Avoiding tobacco and drug use.  Limiting alcohol use.  Practicing safe sex.  Taking low-dose aspirin every day.  Taking vitamin and mineral supplements as recommended by your health care provider. What happens during an annual well check? The  services and screenings done by your health care provider during your annual well check will depend on your age, overall health, lifestyle risk factors, and family history of disease. Counseling  Your health care provider may ask you questions about your:  Alcohol use.  Tobacco use.  Drug use.  Emotional well-being.  Home and relationship well-being.  Sexual activity.  Eating habits.  History of falls.  Memory and ability to understand (cognition).  Work and work Statistician.  Reproductive health. Screening  You may have the following tests or measurements:  Height, weight, and BMI.  Blood pressure.  Lipid and cholesterol levels. These may be checked every 5 years, or more frequently if you are over 24 years old.  Skin check.  Lung cancer screening. You may have this screening every year starting at age 55 if you have a 30-pack-year history of smoking and currently smoke or have quit within the past 15 years.  Fecal occult blood test (FOBT) of the stool. You may have this test every year starting at age 93.  Flexible sigmoidoscopy or colonoscopy. You may have a sigmoidoscopy every 5 years or a colonoscopy every 10 years starting at age 7.  Hepatitis C blood test.  Hepatitis B blood test.  Sexually transmitted disease (STD) testing.  Diabetes screening. This is done by checking your blood sugar (glucose) after you have not eaten for a while (fasting). You may have this done every 1-3 years.  Bone density scan. This is done to  screen for osteoporosis. You may have this done starting at age 50.  Mammogram. This may be done every 1-2 years. Talk to your health care provider about how often you should have regular mammograms. Talk with your health care provider about your test results, treatment options, and if necessary, the need for more tests. Vaccines  Your health care provider may recommend certain vaccines, such as:  Influenza vaccine. This is recommended  every year.  Tetanus, diphtheria, and acellular pertussis (Tdap, Td) vaccine. You may need a Td booster every 10 years.  Zoster vaccine. You may need this after age 83.  Pneumococcal 13-valent conjugate (PCV13) vaccine. One dose is recommended after age 68.  Pneumococcal polysaccharide (PPSV23) vaccine. One dose is recommended after age 58. Talk to your health care provider about which screenings and vaccines you need and how often you need them. This information is not intended to replace advice given to you by your health care provider. Make sure you discuss any questions you have with your health care provider. Document Released: 06/23/2015 Document Revised: 02/14/2016 Document Reviewed: 03/28/2015 Elsevier Interactive Patient Education  2017 [place] Prevention in the Home Falls can cause injuries. They can happen to people of all ages. There are many things you can do to make your home safe and to help prevent falls. What can I do on the outside of my home?  Regularly fix the edges of walkways and driveways and fix any cracks.  Remove anything that might make you trip as you walk through a door, such as a raised step or threshold.  Trim any bushes or trees on the path to your home.  Use bright outdoor lighting.  Clear any walking paths of anything that might make someone trip, such as rocks or tools.  Regularly check to see if handrails are loose or broken. Make sure that both sides of any steps have handrails.  Any raised decks and porches should have guardrails on the edges.  Have any leaves, snow, or ice cleared regularly.  Use sand or salt on walking paths during winter.  Clean up any spills in your garage right away. This includes oil or grease spills. What can I do in the bathroom?  Use night lights.  Install grab bars by the toilet and in the tub and shower. Do not use towel bars as grab bars.  Use non-skid mats or decals in the tub or shower.  If  you need to sit down in the shower, use a plastic, non-slip stool.  Keep the floor dry. Clean up any water that spills on the floor as soon as it happens.  Remove soap buildup in the tub or shower regularly.  Attach bath mats securely with double-sided non-slip rug tape.  Do not have throw rugs and other things on the floor that can make you trip. What can I do in the bedroom?  Use night lights.  Make sure that you have a light by your bed that is easy to reach.  Do not use any sheets or blankets that are too big for your bed. They should not hang down onto the floor.  Have a firm chair that has side arms. You can use this for support while you get dressed.  Do not have throw rugs and other things on the floor that can make you trip. What can I do in the kitchen?  Clean up any spills right away.  Avoid walking on wet floors.  Keep items that  you use a lot in easy-to-reach places.  If you need to reach something above you, use a strong step stool that has a grab bar.  Keep electrical cords out of the way.  Do not use floor polish or wax that makes floors slippery. If you must use wax, use non-skid floor wax.  Do not have throw rugs and other things on the floor that can make you trip. What can I do with my stairs?  Do not leave any items on the stairs.  Make sure that there are handrails on both sides of the stairs and use them. Fix handrails that are broken or loose. Make sure that handrails are as long as the stairways.  Check any carpeting to make sure that it is firmly attached to the stairs. Fix any carpet that is loose or worn.  Avoid having throw rugs at the top or bottom of the stairs. If you do have throw rugs, attach them to the floor with carpet tape.  Make sure that you have a light switch at the top of the stairs and the bottom of the stairs. If you do not have them, ask someone to add them for you. What else can I do to help prevent falls?  Wear shoes  that:  Do not have high heels.  Have rubber bottoms.  Are comfortable and fit you well.  Are closed at the toe. Do not wear sandals.  If you use a stepladder:  Make sure that it is fully opened. Do not climb a closed stepladder.  Make sure that both sides of the stepladder are locked into place.  Ask someone to hold it for you, if possible.  Clearly mark and make sure that you can see:  Any grab bars or handrails.  First and last steps.  Where the edge of each step is.  Use tools that help you move around (mobility aids) if they are needed. These include:  Canes.  Walkers.  Scooters.  Crutches.  Turn on the lights when you go into a dark area. Replace any light bulbs as soon as they burn out.  Set up your furniture so you have a clear path. Avoid moving your furniture around.  If any of your floors are uneven, fix them.  If there are any pets around you, be aware of where they are.  Review your medicines with your doctor. Some medicines can make you feel dizzy. This can increase your chance of falling. Ask your doctor what other things that you can do to help prevent falls. This information is not intended to replace advice given to you by your health care provider. Make sure you discuss any questions you have with your health care provider. Document Released: 03/23/2009 Document Revised: 11/02/2015 Document Reviewed: 07/01/2014 Elsevier Interactive Patient Education  2017 [employer].

## 2020-09-07 NOTE — Progress Notes (Signed)
Subjective:   Natasha Mendoza is a 75 y.o. female who presents for Medicare Annual (Subsequent) preventive examination.  Review of Systems    No ROS.  Medicare Wellness Virtual Visit.    Cardiac Risk Factors include: advanced age (>30men, >76 women);hypertension     Objective:    Today's Vitals   09/07/20 1308  Weight: 172 lb (78 kg)  Height: 5\' 7"  (1.702 m)   Body mass index is 26.94 kg/m.  Advanced Directives 09/07/2020 12/09/2018  Does Patient Have a Medical Advance Directive? Yes No  Type of Advance Directive Living will;Healthcare Power of Attorney -  Does patient want to make changes to medical advance directive? No - Patient declined -  Copy of [place] in Chart? No - copy requested -    Current Medications (verified) Outpatient Encounter Medications as of 09/07/2020  Medication Sig  . alendronate (FOSAMAX) 70 MG tablet TAKE 1 TABLET EVERY 7 DAYS WITH A FULL GLASS OF WATER ON AN EMPTY STOMACH DO NOT LIE DOWN FOR AT LEAST 30 MIN  . aspirin 81 MG tablet Take 81 mg by mouth daily.  . cholecalciferol (VITAMIN D3) 25 MCG (1000 UT) tablet Take 2,000 Units by mouth daily.  Mariane Baumgarten Sodium (COLACE PO) Take 1 tablet by mouth daily.  Marland Kitchen losartan (COZAAR) 50 MG tablet TAKE ONE TABLET EVERY DAY  . Multiple Vitamins-Minerals (PRESERVISION AREDS 2) CAPS Take 1 tablet by mouth 2 (two) times a day.   . polyethylene glycol powder (GLYCOLAX/MIRALAX) powder Take 17 g by mouth 2 (two) times daily as needed.  . simvastatin (ZOCOR) 20 MG tablet TAKE 1 TABLET BY MOUTH AT BEDTIME   No facility-administered encounter medications on file as of 09/07/2020.    Allergies (verified) Erythromycin   History: Past Medical History:  Diagnosis Date  . Constipation   . Hemorrhoids   . Hyperlipidemia   . Hypertension    Past Surgical History:  Procedure Laterality Date  . CHOLECYSTECTOMY  1992  . Right benign breast cyst  1997  . TONSILLECTOMY    . TUBAL LIGATION      Family History  Problem Relation Age of Onset  . Heart attack Father 65       massive  . Hyperlipidemia Father   . Stroke Mother   . Cancer Paternal Aunt        breast  . Diabetes Neg Hx   . Colon cancer Neg Hx    Social History   Socioeconomic History  . Marital status: Married    Spouse name: Not on file  . Number of children: 3  . Years of education: Not on file  . Highest education level: Not on file  Occupational History  . Occupation: Retired   Tobacco Use  . Smoking status: Never Smoker  . Smokeless tobacco: Never Used  Vaping Use  . Vaping Use: Never used  Substance and Sexual Activity  . Alcohol use: Yes    Alcohol/week: 1.0 standard drink    Types: 1 Glasses of wine per week    Comment: occasionally  . Drug use: No  . Sexual activity: Not Currently  Other Topics Concern  . Not on file  Social History Narrative   Exercise: elliptical 4-5 days a week at [place].    Healthy eating habits, no fried foods.   Has HCPOA: husband or daughter, Has living will. Full code (Reviewed 2015)   2 caffeine drinks daily    Social Determinants of Health  Financial Resource Strain: Low Risk   . Difficulty of Paying Living Expenses: Not hard at all  Food Insecurity: No Food Insecurity  . Worried About Charity fundraiser in the Last Year: Never true  . Ran Out of Food in the Last Year: Never true  Transportation Needs: No Transportation Needs  . Lack of Transportation (Medical): No  . Lack of Transportation (Non-Medical): No  Physical Activity: Insufficiently Active  . Days of Exercise per Week: 3 days  . Minutes of Exercise per Session: 30 min  Stress: No Stress Concern Present  . Feeling of Stress : Not at all  Social Connections: Unknown  . Frequency of Communication with Friends and Family: Not on file  . Frequency of Social Gatherings with Friends and Family: Not on file  . Attends Religious Services: Not on file  . Active Member of Clubs or Organizations: Not  on file  . Attends Archivist Meetings: Not on file  . Marital Status: Married    Tobacco Counseling Counseling given: Not Answered   Clinical Intake:  Pre-visit preparation completed: Yes        Diabetes: No  How often do you need to have someone help you when you read instructions, pamphlets, or other written materials from your doctor or pharmacy?: 1 - Never    Interpreter Needed?: No      Activities of Daily Living In your present state of health, do you have any difficulty performing the following activities: 09/07/2020  Hearing? N  Vision? N  Difficulty concentrating or making decisions? N  Walking or climbing stairs? N  Dressing or bathing? N  Doing errands, shopping? N  Preparing Food and eating ? N  Using the Toilet? N  In the past six months, have you accidently leaked urine? N  Do you have problems with loss of bowel control? N  Managing your Medications? N  Managing your Finances? N  Housekeeping or managing your Housekeeping? N  Some recent data might be hidden    Patient Care Team: Leone Haven, MD as PCP - General (Family Medicine)  Indicate any recent Medical Services you may have received from other than [facility] providers in the past year (date may be approximate).     Assessment:   This is a routine wellness examination for [facility].  I connected with Arriah today by telephone and verified that I am speaking with the correct person using two identifiers. Location patient: home Location provider: work Persons participating in the virtual visit: patient, Marine scientist.    I discussed the limitations, risks, security and privacy concerns of performing an evaluation and management service by telephone and the availability of in person appointments. The patient expressed understanding and verbally consented to this telephonic visit.    Interactive audio and video telecommunications were attempted between this provider and patient, however  failed, due to patient having technical difficulties OR patient did not have access to video capability.  We continued and completed visit with audio only.  Some vital signs may be absent or patient reported.   Hearing/Vision screen  Hearing Screening   125Hz  250Hz  500Hz  1000Hz  2000Hz  3000Hz  4000Hz  6000Hz  8000Hz   Right ear:           Left ear:           Comments: Patient is able to hear conversational tones without difficulty.  No issues reported.  Vision Screening Comments: Followed by Dr. Zigmund Daniel Wears corrective lenses Visual acuity not assessed, virtual visit.  They have seen their ophthalmologist every 4 weeks. Wet macular degeneration   Dietary issues and exercise activities discussed: Current Exercise Habits: Home exercise routine, Type of exercise: walking, Time (Minutes): 30, Frequency (Times/Week): 3, Weekly Exercise (Minutes/Week): 90, Intensity: Mild  Goals    . Follow up with Primary Care Provider     As needed      Depression Screen PHQ 2/9 Scores 09/07/2020 07/21/2020 07/19/2019 07/01/2018 10/03/2016 03/20/2015 03/17/2014  PHQ - 2 Score 0 0 0 0 0 0 0    Fall Risk Fall Risk  09/07/2020 07/21/2020 04/28/2020 07/19/2019 07/01/2018  Falls in the past year? 0 0 0 0 0  Comment - - - - -  Number falls in past yr: 0 0 0 0 0  Injury with Fall? 0 - 0 - 0  Follow up Falls evaluation completed Falls evaluation completed Falls evaluation completed Falls evaluation completed -    FALL RISK PREVENTION PERTAINING TO THE HOME: Handrails in use when climbing stairs? Yes Home free of loose throw rugs in walkways, pet beds, electrical cords, etc? Yes  Adequate lighting in your home to reduce risk of falls? Yes   ASSISTIVE DEVICES UTILIZED TO PREVENT FALLS: Life alert? No  Use of a cane, walker or w/c? No   TIMED UP AND GO: Was the test performed? No . Virtual visit.   Cognitive Function:  Patient is alert and oriented x3.  Denies difficulty focusing, making decisions, memory  loss.  MMSE/6CIT deferred. Normal by direct communication.      Immunizations Immunization History  Administered Date(s) Administered  . PFIZER(Purple Top)SARS-COV-2 Vaccination 07/16/2019    Health Maintenance Health Maintenance  Topic Date Due  . COVID-19 Vaccine (2 - Pfizer 3-dose series) 08/06/2019  . INFLUENZA VACCINE  09/07/2020 (Originally 01/09/2020)  . TETANUS/TDAP  12/17/2020  . Fecal DNA (Cologuard)  08/01/2023  . DEXA SCAN  Completed  . HPV VACCINES  Aged Out   Mammogram status: Completed 09/06/19. Repeat every year.    Lung Cancer Screening: (Low Dose CT Chest recommended if Age 47-80 years, 30 pack-year currently smoking OR have quit w/in 15years.) does not qualify.   Dental Screening: Recommended annual dental exams for proper oral hygiene.  Community Resource Referral / Chronic Care Management: CRR required this visit?  No   CCM required this visit?  No      Plan:   Keep all routine maintenance appointments.   I have personally reviewed and noted the following in the patient's chart:   . Medical and social history . Use of alcohol, tobacco or illicit drugs  . Current medications and supplements- no opioids in use . Functional ability and status . Nutritional status . Physical activity . Advanced directives . List of other physicians . Hospitalizations, surgeries, and ER visits in previous 12 months . Vitals . Screenings to include cognitive, depression, and falls . Referrals and appointments  In addition, I have reviewed and discussed with patient certain preventive protocols, quality metrics, and best practice recommendations. A written personalized care plan for preventive services as well as general preventive health recommendations were provided to patient via mychart.     Varney Biles, LPN   [id number]/[id number]

## 2020-09-11 LAB — HM MAMMOGRAPHY

## 2020-10-03 ENCOUNTER — Other Ambulatory Visit: Payer: Self-pay

## 2020-10-03 ENCOUNTER — Encounter (INDEPENDENT_AMBULATORY_CARE_PROVIDER_SITE_OTHER): Payer: Medicare Other | Admitting: Ophthalmology

## 2020-10-03 DIAGNOSIS — I10 Essential (primary) hypertension: Secondary | ICD-10-CM | POA: Diagnosis not present

## 2020-10-03 DIAGNOSIS — H35033 Hypertensive retinopathy, bilateral: Secondary | ICD-10-CM

## 2020-10-03 DIAGNOSIS — H43813 Vitreous degeneration, bilateral: Secondary | ICD-10-CM

## 2020-10-03 DIAGNOSIS — H353231 Exudative age-related macular degeneration, bilateral, with active choroidal neovascularization: Secondary | ICD-10-CM | POA: Diagnosis not present

## 2020-10-04 ENCOUNTER — Encounter: Payer: Self-pay | Admitting: Obstetrics and Gynecology

## 2020-10-10 ENCOUNTER — Encounter: Payer: Self-pay | Admitting: Family Medicine

## 2020-10-18 ENCOUNTER — Other Ambulatory Visit: Payer: Medicare Other

## 2020-10-20 ENCOUNTER — Ambulatory Visit: Payer: Medicare Other | Admitting: Family Medicine

## 2020-10-31 ENCOUNTER — Encounter (INDEPENDENT_AMBULATORY_CARE_PROVIDER_SITE_OTHER): Payer: Medicare Other | Admitting: Ophthalmology

## 2020-10-31 ENCOUNTER — Other Ambulatory Visit: Payer: Self-pay

## 2020-10-31 DIAGNOSIS — H353231 Exudative age-related macular degeneration, bilateral, with active choroidal neovascularization: Secondary | ICD-10-CM

## 2020-10-31 DIAGNOSIS — H35033 Hypertensive retinopathy, bilateral: Secondary | ICD-10-CM | POA: Diagnosis not present

## 2020-10-31 DIAGNOSIS — H43813 Vitreous degeneration, bilateral: Secondary | ICD-10-CM | POA: Diagnosis not present

## 2020-10-31 DIAGNOSIS — I10 Essential (primary) hypertension: Secondary | ICD-10-CM

## 2020-11-06 ENCOUNTER — Encounter: Payer: Self-pay | Admitting: Internal Medicine

## 2020-11-06 ENCOUNTER — Other Ambulatory Visit: Payer: Self-pay | Admitting: Internal Medicine

## 2020-11-06 DIAGNOSIS — J208 Acute bronchitis due to other specified organisms: Secondary | ICD-10-CM | POA: Insufficient documentation

## 2020-11-06 DIAGNOSIS — U071 COVID-19: Secondary | ICD-10-CM | POA: Insufficient documentation

## 2020-11-06 MED ORDER — PAXLOVID 20 X 150 MG & 10 X 100MG PO TBPK: 3.0000 | ORAL_TABLET | Freq: Two times a day (BID) | ORAL | 0 refills | Status: AC

## 2020-11-13 ENCOUNTER — Other Ambulatory Visit: Payer: Medicare Other

## 2020-11-15 ENCOUNTER — Ambulatory Visit: Payer: Medicare Other | Admitting: Family Medicine

## 2020-11-28 ENCOUNTER — Encounter (INDEPENDENT_AMBULATORY_CARE_PROVIDER_SITE_OTHER): Payer: Medicare Other | Admitting: Ophthalmology

## 2020-11-28 ENCOUNTER — Other Ambulatory Visit: Payer: Self-pay

## 2020-11-28 DIAGNOSIS — H353231 Exudative age-related macular degeneration, bilateral, with active choroidal neovascularization: Secondary | ICD-10-CM | POA: Diagnosis not present

## 2020-11-28 DIAGNOSIS — I10 Essential (primary) hypertension: Secondary | ICD-10-CM | POA: Diagnosis not present

## 2020-11-28 DIAGNOSIS — H43813 Vitreous degeneration, bilateral: Secondary | ICD-10-CM

## 2020-11-28 DIAGNOSIS — H35033 Hypertensive retinopathy, bilateral: Secondary | ICD-10-CM | POA: Diagnosis not present

## 2020-12-04 ENCOUNTER — Telehealth: Payer: Self-pay | Admitting: *Deleted

## 2020-12-04 DIAGNOSIS — E7800 Pure hypercholesterolemia, unspecified: Secondary | ICD-10-CM

## 2020-12-04 NOTE — Telephone Encounter (Signed)
Please place future orders for lab appt.  

## 2020-12-05 ENCOUNTER — Other Ambulatory Visit: Payer: Self-pay

## 2020-12-05 ENCOUNTER — Other Ambulatory Visit (INDEPENDENT_AMBULATORY_CARE_PROVIDER_SITE_OTHER): Payer: Medicare Other

## 2020-12-05 DIAGNOSIS — E7800 Pure hypercholesterolemia, unspecified: Secondary | ICD-10-CM | POA: Diagnosis not present

## 2020-12-05 LAB — LIPID PANEL
Cholesterol: 168 mg/dL (ref 0–200)
HDL: 44.7 mg/dL (ref >39.00)
LDL Cholesterol: 94 mg/dL (ref 0–99)
NonHDL: 122.98
Total CHOL/HDL Ratio: 4
Triglycerides: 147 mg/dL (ref 0.0–149.0)
VLDL: 29.4 mg/dL (ref 0.0–40.0)

## 2020-12-07 ENCOUNTER — Ambulatory Visit: Payer: Medicare Other | Admitting: Family Medicine

## 2020-12-07 ENCOUNTER — Other Ambulatory Visit: Payer: Self-pay

## 2020-12-07 VITALS — BP 125/80 | HR 83 | Temp 98.2°F | Ht 67.0 in | Wt 161.8 lb

## 2020-12-07 DIAGNOSIS — I10 Essential (primary) hypertension: Secondary | ICD-10-CM

## 2020-12-07 DIAGNOSIS — Z8616 Personal history of COVID-19: Secondary | ICD-10-CM | POA: Insufficient documentation

## 2020-12-07 DIAGNOSIS — R42 Dizziness and giddiness: Secondary | ICD-10-CM

## 2020-12-07 DIAGNOSIS — E785 Hyperlipidemia, unspecified: Secondary | ICD-10-CM | POA: Diagnosis not present

## 2020-12-07 DIAGNOSIS — E663 Overweight: Secondary | ICD-10-CM

## 2020-12-07 NOTE — Progress Notes (Signed)
Tommi Rumps, MD Phone: [phone]  Natasha Mendoza is a 75 y.o. female who presents today for follow-up.  Overweight: The patient altered her diet fairly significantly.  She cut down on pasta and potatoes.  She is hardly had any sweets.  She was exercising frequently though she had COVID and that kind of zapped her energy for a while.  Elevated LDL: This is improved on the recent lab work.  She continues on simvastatin.  History of COVID-19: She had this at the end of May.  Still has some mild cough and some intermittent diarrhea.  Lightheadedness: Patient has intermittent issues with lightheadedness that typically only occurs when she is outside in the sun.  She notes she feels as though she is adequately hydrated.  She will just be sitting there and feel lightheaded and this will worsen when she stands up.  This was going on prior to her having COVID.  Social History   Tobacco Use  Smoking Status Never  Smokeless Tobacco Never    Current Outpatient Medications on File Prior to Visit  Medication Sig Dispense Refill   alendronate (FOSAMAX) 70 MG tablet TAKE 1 TABLET EVERY 7 DAYS WITH A FULL GLASS OF WATER ON AN EMPTY STOMACH DO NOT LIE DOWN FOR AT LEAST 30 MIN 4 tablet 11   aspirin 81 MG tablet Take 81 mg by mouth daily.     cholecalciferol (VITAMIN D3) 25 MCG (1000 UT) tablet Take 2,000 Units by mouth daily.     Docusate Sodium (COLACE PO) Take 1 tablet by mouth daily.     losartan (COZAAR) 50 MG tablet TAKE ONE TABLET EVERY DAY 90 tablet 3   Multiple Vitamins-Minerals (PRESERVISION AREDS 2) CAPS Take 1 tablet by mouth 2 (two) times a day.      polyethylene glycol powder (GLYCOLAX/MIRALAX) powder Take 17 g by mouth 2 (two) times daily as needed. 850 g 1   simvastatin (ZOCOR) 20 MG tablet TAKE 1 TABLET BY MOUTH AT BEDTIME 90 tablet 3   No current facility-administered medications on file prior to visit.     ROS see history of present illness  Objective  Physical  Exam Vitals:   12/07/20 1401  BP: 125/80  Pulse: 83  Temp: 98.2 F (36.8 C)  SpO2: 99%   Laying blood pressure 132/82 pulse 75 Sitting blood pressure 136/83 pulse 83 Standing blood pressure 126/81 pulse 89  BP Readings from Last 3 Encounters:  12/07/20 125/80  07/21/20 120/80  05/22/20 (!) 163/87   Wt Readings from Last 3 Encounters:  12/07/20 161 lb 12.8 oz (73.4 kg)  09/07/20 172 lb (78 kg)  07/21/20 176 lb (79.8 kg)    Physical Exam Constitutional:      General: She is not in acute distress.    Appearance: She is not diaphoretic.  Cardiovascular:     Rate and Rhythm: Normal rate and regular rhythm.     Heart sounds: Normal heart sounds.     Comments: No carotid bruits Pulmonary:     Effort: Pulmonary effort is normal.     Breath sounds: Normal breath sounds.  Skin:    General: Skin is warm and dry.  Neurological:     Mental Status: She is alert.     Assessment/Plan: Please see individual problem list.  Problem List Items Addressed This Visit     Essential hypertension    Blood pressure has been adequately controlled though she does continue to have lightheadedness issues.  The lightheaded episodes typically only  occur when she is outside in the heat and thus that may be contributing.  There is the potential that she could be getting dehydrated.  We will check lab work to evaluate for other causes.  She does not have any evidence of carotid bruits and her auscultatory heart exam sounds normal.  If labs are unremarkable could consider holding her losartan versus attempting further work-up.       History of COVID-19    Progressively improving.  She does have some intermittent cough and diarrhea still.  She will monitor the symptoms as I suspect they will improve over the next several weeks.  If not improving she will let us know.       Hyperlipidemia    Improved.  She will continue simvastatin.       Overweight    Her weight is improving with diet and  exercise.  She will continue dietary changes and exercise changes.       Other Visit Diagnoses     Lightheadedness    -  Primary   Relevant Orders   CBC   Basic Metabolic Panel (BMET)       Return in about 3 months (around 03/09/2021) for Weight.  This visit occurred during the SARS-CoV-2 public health emergency.  Safety protocols were in place, including screening questions prior to the visit, additional usage of staff PPE, and extensive cleaning of exam room while observing appropriate contact time as indicated for disinfecting solutions.    Tommi Rumps, MD [place]

## 2020-12-07 NOTE — Assessment & Plan Note (Signed)
Progressively improving.  She does have some intermittent cough and diarrhea still.  She will monitor the symptoms as I suspect they will improve over the next several weeks.  If not improving she will let us know.

## 2020-12-07 NOTE — Patient Instructions (Signed)
Nice to see you. Please continue to work on diet and exercise. We will get some lab work today and contact you with the results. Please rise slowly from seated positions.  Please try to avoid sitting out in the heat.  Please remain well-hydrated.

## 2020-12-07 NOTE — Assessment & Plan Note (Signed)
Improved.  She will continue simvastatin.

## 2020-12-07 NOTE — Assessment & Plan Note (Addendum)
Blood pressure has been adequately controlled though she does continue to have lightheadedness issues.  The lightheaded episodes typically only occur when she is outside in the heat and thus that may be contributing.  There is the potential that she could be getting dehydrated.  We will check lab work to evaluate for other causes.  She does not have any evidence of carotid bruits and her auscultatory heart exam sounds normal.  If labs are unremarkable could consider holding her losartan versus attempting further work-up.  I did encourage her to stay adequately hydrated and when she is going to be outside.

## 2020-12-07 NOTE — Assessment & Plan Note (Signed)
Her weight is improving with diet and exercise.  She will continue dietary changes and exercise changes.

## 2020-12-08 LAB — BASIC METABOLIC PANEL
BUN: 17 mg/dL (ref 6–23)
CO2: 28 mEq/L (ref 19–32)
Calcium: 9.3 mg/dL (ref 8.4–10.5)
Chloride: 104 mEq/L (ref 96–112)
Creatinine, Ser: 0.84 mg/dL (ref 0.40–1.20)
GFR: 67.88 mL/min (ref >60.00)
Glucose, Bld: 141 mg/dL — ABNORMAL HIGH (ref 70–99)
Potassium: 3.9 mEq/L (ref 3.5–5.1)
Sodium: 140 mEq/L (ref 135–145)

## 2020-12-08 LAB — CBC
HCT: 38.9 % (ref 36.0–46.0)
Hemoglobin: 13 g/dL (ref 12.0–15.0)
MCHC: 33.5 g/dL (ref 30.0–36.0)
MCV: 91.2 fl (ref 78.0–100.0)
Platelets: 272 10*3/uL (ref 150.0–400.0)
RBC: 4.26 Mil/uL (ref 3.87–5.11)
RDW: 13.6 % (ref 11.5–15.5)
WBC: 7.6 10*3/uL (ref 4.0–10.5)

## 2020-12-25 ENCOUNTER — Encounter: Payer: Self-pay | Admitting: Obstetrics and Gynecology

## 2020-12-26 ENCOUNTER — Other Ambulatory Visit: Payer: Self-pay

## 2020-12-26 ENCOUNTER — Encounter (INDEPENDENT_AMBULATORY_CARE_PROVIDER_SITE_OTHER): Payer: Medicare Other | Admitting: Ophthalmology

## 2020-12-26 DIAGNOSIS — I10 Essential (primary) hypertension: Secondary | ICD-10-CM

## 2020-12-26 DIAGNOSIS — H43813 Vitreous degeneration, bilateral: Secondary | ICD-10-CM

## 2020-12-26 DIAGNOSIS — H35033 Hypertensive retinopathy, bilateral: Secondary | ICD-10-CM

## 2020-12-26 DIAGNOSIS — H353231 Exudative age-related macular degeneration, bilateral, with active choroidal neovascularization: Secondary | ICD-10-CM

## 2021-01-23 ENCOUNTER — Other Ambulatory Visit: Payer: Self-pay

## 2021-01-23 ENCOUNTER — Encounter (INDEPENDENT_AMBULATORY_CARE_PROVIDER_SITE_OTHER): Payer: Medicare Other | Admitting: Ophthalmology

## 2021-01-23 DIAGNOSIS — H35033 Hypertensive retinopathy, bilateral: Secondary | ICD-10-CM

## 2021-01-23 DIAGNOSIS — H43813 Vitreous degeneration, bilateral: Secondary | ICD-10-CM | POA: Diagnosis not present

## 2021-01-23 DIAGNOSIS — H353231 Exudative age-related macular degeneration, bilateral, with active choroidal neovascularization: Secondary | ICD-10-CM | POA: Diagnosis not present

## 2021-01-23 DIAGNOSIS — I10 Essential (primary) hypertension: Secondary | ICD-10-CM | POA: Diagnosis not present

## 2021-02-20 ENCOUNTER — Encounter (INDEPENDENT_AMBULATORY_CARE_PROVIDER_SITE_OTHER): Payer: Medicare Other | Admitting: Ophthalmology

## 2021-02-20 ENCOUNTER — Other Ambulatory Visit: Payer: Self-pay

## 2021-02-20 DIAGNOSIS — H35033 Hypertensive retinopathy, bilateral: Secondary | ICD-10-CM

## 2021-02-20 DIAGNOSIS — H43813 Vitreous degeneration, bilateral: Secondary | ICD-10-CM

## 2021-02-20 DIAGNOSIS — I10 Essential (primary) hypertension: Secondary | ICD-10-CM | POA: Diagnosis not present

## 2021-02-20 DIAGNOSIS — H353231 Exudative age-related macular degeneration, bilateral, with active choroidal neovascularization: Secondary | ICD-10-CM

## 2021-03-05 ENCOUNTER — Other Ambulatory Visit: Payer: Self-pay | Admitting: Family Medicine

## 2021-03-20 ENCOUNTER — Other Ambulatory Visit: Payer: Self-pay

## 2021-03-20 ENCOUNTER — Encounter (INDEPENDENT_AMBULATORY_CARE_PROVIDER_SITE_OTHER): Payer: Medicare Other | Admitting: Ophthalmology

## 2021-03-20 DIAGNOSIS — H43813 Vitreous degeneration, bilateral: Secondary | ICD-10-CM

## 2021-03-20 DIAGNOSIS — H35033 Hypertensive retinopathy, bilateral: Secondary | ICD-10-CM

## 2021-03-20 DIAGNOSIS — H353231 Exudative age-related macular degeneration, bilateral, with active choroidal neovascularization: Secondary | ICD-10-CM

## 2021-03-20 DIAGNOSIS — I10 Essential (primary) hypertension: Secondary | ICD-10-CM | POA: Diagnosis not present

## 2021-04-17 ENCOUNTER — Other Ambulatory Visit: Payer: Self-pay

## 2021-04-17 ENCOUNTER — Encounter (INDEPENDENT_AMBULATORY_CARE_PROVIDER_SITE_OTHER): Payer: Medicare Other | Admitting: Ophthalmology

## 2021-04-17 DIAGNOSIS — I10 Essential (primary) hypertension: Secondary | ICD-10-CM

## 2021-04-17 DIAGNOSIS — H35033 Hypertensive retinopathy, bilateral: Secondary | ICD-10-CM

## 2021-04-17 DIAGNOSIS — H353231 Exudative age-related macular degeneration, bilateral, with active choroidal neovascularization: Secondary | ICD-10-CM

## 2021-04-17 DIAGNOSIS — H43813 Vitreous degeneration, bilateral: Secondary | ICD-10-CM

## 2021-05-15 ENCOUNTER — Encounter (INDEPENDENT_AMBULATORY_CARE_PROVIDER_SITE_OTHER): Payer: Medicare Other | Admitting: Ophthalmology

## 2021-05-17 ENCOUNTER — Other Ambulatory Visit: Payer: Self-pay

## 2021-05-17 ENCOUNTER — Encounter (INDEPENDENT_AMBULATORY_CARE_PROVIDER_SITE_OTHER): Payer: Medicare Other | Admitting: Ophthalmology

## 2021-05-17 DIAGNOSIS — H35033 Hypertensive retinopathy, bilateral: Secondary | ICD-10-CM

## 2021-05-17 DIAGNOSIS — H353231 Exudative age-related macular degeneration, bilateral, with active choroidal neovascularization: Secondary | ICD-10-CM

## 2021-05-17 DIAGNOSIS — H43813 Vitreous degeneration, bilateral: Secondary | ICD-10-CM | POA: Diagnosis not present

## 2021-05-17 DIAGNOSIS — I10 Essential (primary) hypertension: Secondary | ICD-10-CM

## 2021-05-25 ENCOUNTER — Other Ambulatory Visit: Payer: Self-pay

## 2021-05-25 DIAGNOSIS — Z0000 Encounter for general adult medical examination without abnormal findings: Secondary | ICD-10-CM

## 2021-06-02 ENCOUNTER — Other Ambulatory Visit: Payer: Self-pay | Admitting: Family Medicine

## 2021-06-02 DIAGNOSIS — I10 Essential (primary) hypertension: Secondary | ICD-10-CM

## 2021-06-14 ENCOUNTER — Encounter (INDEPENDENT_AMBULATORY_CARE_PROVIDER_SITE_OTHER): Payer: Medicare Other | Admitting: Ophthalmology

## 2021-06-14 ENCOUNTER — Other Ambulatory Visit: Payer: Self-pay

## 2021-06-14 DIAGNOSIS — H353231 Exudative age-related macular degeneration, bilateral, with active choroidal neovascularization: Secondary | ICD-10-CM | POA: Diagnosis not present

## 2021-06-14 DIAGNOSIS — H43813 Vitreous degeneration, bilateral: Secondary | ICD-10-CM

## 2021-06-14 DIAGNOSIS — H35033 Hypertensive retinopathy, bilateral: Secondary | ICD-10-CM

## 2021-06-14 DIAGNOSIS — I10 Essential (primary) hypertension: Secondary | ICD-10-CM | POA: Diagnosis not present

## 2021-07-08 ENCOUNTER — Other Ambulatory Visit: Payer: Self-pay | Admitting: Family Medicine

## 2021-07-11 ENCOUNTER — Other Ambulatory Visit: Payer: Self-pay

## 2021-07-11 ENCOUNTER — Encounter (INDEPENDENT_AMBULATORY_CARE_PROVIDER_SITE_OTHER): Payer: Medicare Other | Admitting: Ophthalmology

## 2021-07-11 DIAGNOSIS — H43813 Vitreous degeneration, bilateral: Secondary | ICD-10-CM | POA: Diagnosis not present

## 2021-07-11 DIAGNOSIS — I10 Essential (primary) hypertension: Secondary | ICD-10-CM | POA: Diagnosis not present

## 2021-07-11 DIAGNOSIS — H35033 Hypertensive retinopathy, bilateral: Secondary | ICD-10-CM

## 2021-07-11 DIAGNOSIS — H353231 Exudative age-related macular degeneration, bilateral, with active choroidal neovascularization: Secondary | ICD-10-CM

## 2021-08-02 ENCOUNTER — Other Ambulatory Visit (INDEPENDENT_AMBULATORY_CARE_PROVIDER_SITE_OTHER): Payer: Medicare Other

## 2021-08-02 ENCOUNTER — Other Ambulatory Visit: Payer: Self-pay

## 2021-08-02 DIAGNOSIS — Z0000 Encounter for general adult medical examination without abnormal findings: Secondary | ICD-10-CM

## 2021-08-02 LAB — COMPREHENSIVE METABOLIC PANEL
ALT: 21 U/L (ref 0–35)
AST: 8 U/L (ref 0–37)
Albumin: 4.1 g/dL (ref 3.5–5.2)
Alkaline Phosphatase: 71 U/L (ref 39–117)
BUN: 16 mg/dL (ref 6–23)
CO2: 30 mEq/L (ref 19–32)
Calcium: 8.9 mg/dL (ref 8.4–10.5)
Chloride: 105 mEq/L (ref 96–112)
Creatinine, Ser: 0.72 mg/dL (ref 0.40–1.20)
GFR: 81.3 mL/min (ref >60.00)
Glucose, Bld: 97 mg/dL (ref 70–99)
Potassium: 4.5 mEq/L (ref 3.5–5.1)
Sodium: 140 mEq/L (ref 135–145)
Total Bilirubin: 0.6 mg/dL (ref 0.2–1.2)
Total Protein: 6.7 g/dL (ref 6.0–8.3)

## 2021-08-02 LAB — CBC
HCT: 39.5 % (ref 36.0–46.0)
Hemoglobin: 13.1 g/dL (ref 12.0–15.0)
MCHC: 33.2 g/dL (ref 30.0–36.0)
MCV: 91.1 fl (ref 78.0–100.0)
Platelets: 284 10*3/uL (ref 150.0–400.0)
RBC: 4.34 Mil/uL (ref 3.87–5.11)
RDW: 13.6 % (ref 11.5–15.5)
WBC: 6.5 10*3/uL (ref 4.0–10.5)

## 2021-08-02 LAB — HEMOGLOBIN A1C: Hgb A1c MFr Bld: 5.8 % (ref 4.6–6.5)

## 2021-08-03 ENCOUNTER — Encounter: Payer: Self-pay | Admitting: Family Medicine

## 2021-08-03 LAB — LIPID PANEL WITH LDL/HDL RATIO
Cholesterol, Total: 175 mg/dL (ref 100–199)
HDL: 55 mg/dL (ref >39)
LDL Chol Calc (NIH): 99 mg/dL (ref 0–99)
LDL/HDL Ratio: 1.8 ratio (ref 0.0–3.2)
Triglycerides: 118 mg/dL (ref 0–149)
VLDL Cholesterol Cal: 21 mg/dL (ref 5–40)

## 2021-08-08 ENCOUNTER — Encounter (INDEPENDENT_AMBULATORY_CARE_PROVIDER_SITE_OTHER): Payer: Medicare Other | Admitting: Ophthalmology

## 2021-08-08 ENCOUNTER — Other Ambulatory Visit: Payer: Self-pay

## 2021-08-08 DIAGNOSIS — H35033 Hypertensive retinopathy, bilateral: Secondary | ICD-10-CM | POA: Diagnosis not present

## 2021-08-08 DIAGNOSIS — I10 Essential (primary) hypertension: Secondary | ICD-10-CM | POA: Diagnosis not present

## 2021-08-08 DIAGNOSIS — H353231 Exudative age-related macular degeneration, bilateral, with active choroidal neovascularization: Secondary | ICD-10-CM

## 2021-08-08 DIAGNOSIS — H43813 Vitreous degeneration, bilateral: Secondary | ICD-10-CM

## 2021-08-08 NOTE — Telephone Encounter (Signed)
Pt called in stating that you called her to advise her to get her lab work done. Pt called in today stating that she already did her lab work.  ?

## 2021-08-14 ENCOUNTER — Encounter: Payer: Self-pay | Admitting: Family Medicine

## 2021-08-14 ENCOUNTER — Ambulatory Visit (INDEPENDENT_AMBULATORY_CARE_PROVIDER_SITE_OTHER): Payer: Medicare Other | Admitting: Family Medicine

## 2021-08-14 ENCOUNTER — Other Ambulatory Visit: Payer: Self-pay

## 2021-08-14 VITALS — BP 110/70 | HR 68 | Temp 97.7°F | Ht 67.0 in | Wt 156.6 lb

## 2021-08-14 DIAGNOSIS — I10 Essential (primary) hypertension: Secondary | ICD-10-CM | POA: Diagnosis not present

## 2021-08-14 DIAGNOSIS — E785 Hyperlipidemia, unspecified: Secondary | ICD-10-CM | POA: Diagnosis not present

## 2021-08-14 DIAGNOSIS — R7303 Prediabetes: Secondary | ICD-10-CM

## 2021-08-14 DIAGNOSIS — Z0000 Encounter for general adult medical examination without abnormal findings: Secondary | ICD-10-CM

## 2021-08-14 DIAGNOSIS — M810 Age-related osteoporosis without current pathological fracture: Secondary | ICD-10-CM

## 2021-08-14 NOTE — Patient Instructions (Signed)
Nice to see you. ?Somebody should get your DEXA scan ordered to be done with your mammogram. ?Please continue with the Fosamax. ?Please continue diet and exercise. ?

## 2021-08-14 NOTE — Progress Notes (Signed)
?Tommi Rumps, MD ?Phone: [phone] ? ?Natasha Mendoza is a 76 y.o. female who presents today for CPE. ? ?Diet: no fired foods, limits sweets, no soda or sweet tea, some vegetables, plenty of fruit ?Exercise: walks 3-5 miles 4-5x/week ?Pap smear: aged out ?Colonoscopy: cologuard 07/31/20 negative ?Mammogram: scheduled for april ?Family history- ? Colon cancer: no ? Breast cancer: paternal aunt ? Ovarian cancer: no ?Menses: postmenopausal ?Vaccines-  ? Flu: declines ? Tetanus: declines ? Shingles: declines ? COVID19: x3 ? Pneumonia: declines ?Hep C Screening: UTD ?Tobacco use: no ?Alcohol use: occasional wine ?Illicit Drug use: no ?Dentist: no - has dentures ?Ophthalmology: yes for macular degeneration every 4 weeks ? ? ?Active Ambulatory Problems  ?  Diagnosis Date Noted  ?? Hyperlipidemia 05/26/2007  ?? Constipation 08/30/2008  ?? Prediabetes 07/19/2008  ?? Osteoporosis 01/04/2011  ?? DOE (dyspnea on exertion) 04/05/2016  ?? Hot flashes 04/05/2016  ?? Essential hypertension 06/20/2017  ?? Overweight 06/20/2017  ?? Skin lesion 06/20/2017  ?? Macular degeneration of both eyes 07/01/2018  ?? Hemorrhoids 04/28/2020  ?? Routine general medical examination at a health care facility 07/21/2020  ?? History of COVID-19 12/07/2020  ? ?Resolved Ambulatory Problems  ?  Diagnosis Date Noted  ?? MENOPAUSAL SYNDROME 05/26/2007  ?? CONTACT DERMATITIS 12/06/2008  ?? Cough 08/17/2007  ?? URINALYSIS, ABNORMAL 05/27/2007  ?? Epigastric abdominal pain 03/21/2011  ?? Flank pain 03/21/2011  ?? Elevated BP 03/20/2015  ?? Cough 11/23/2015  ?? Acute bronchitis due to COVID-19 virus 11/06/2020  ? ?Past Medical History:  ?Diagnosis Date  ?? Constipation   ?? Hemorrhoids   ?? Hypertension   ? ? ?Family History  ?Problem Relation Age of Onset  ?? Heart attack Father 38  ?     massive  ?? Hyperlipidemia Father   ?? Stroke Mother   ?? Cancer Paternal Aunt   ?     breast  ?? Diabetes Neg Hx   ?? Colon cancer Neg Hx   ? ? ?Social History   ? ?Socioeconomic History  ?? Marital status: Married  ?  Spouse name: Not on file  ?? Number of children: 3  ?? Years of education: Not on file  ?? Highest education level: Not on file  ?Occupational History  ?? Occupation: Retired   ?Tobacco Use  ?? Smoking status: Never  ?? Smokeless tobacco: Never  ?Vaping Use  ?? Vaping Use: Never used  ?Substance and Sexual Activity  ?? Alcohol use: Yes  ?  Alcohol/week: 1.0 standard drink  ?  Types: 1 Glasses of wine per week  ?  Comment: occasionally  ?? Drug use: No  ?? Sexual activity: Not Currently  ?Other Topics Concern  ?? Not on file  ?Social History Narrative  ? Exercise: elliptical 4-5 days a week at [facility].  ?  Healthy eating habits, no fried foods.  ? Has HCPOA: husband or daughter, Has living will. Full code (Reviewed 2015)  ? 2 caffeine drinks daily   ? ?Social Determinants of Health  ? ?Financial Resource Strain: Low Risk   ?? Difficulty of Paying Living Expenses: Not hard at all  ?Food Insecurity: No Food Insecurity  ?? Worried About Charity fundraiser in the Last Year: Never true  ?? Ran Out of Food in the Last Year: Never true  ?Transportation Needs: No Transportation Needs  ?? Lack of Transportation (Medical): No  ?? Lack of Transportation (Non-Medical): No  ?Physical Activity: Insufficiently Active  ?? Days of Exercise per Week: 3 days  ??  Minutes of Exercise per Session: 30 min  ?Stress: No Stress Concern Present  ?? Feeling of Stress : Not at all  ?Social Connections: Unknown  ?? Frequency of Communication with Friends and Family: Not on file  ?? Frequency of Social Gatherings with Friends and Family: Not on file  ?? Attends Religious Services: Not on file  ?? Active Member of Clubs or Organizations: Not on file  ?? Attends Archivist Meetings: Not on file  ?? Marital Status: Married  ?Intimate Partner Violence: Not At Risk  ?? Fear of Current or Ex-Partner: No  ?? Emotionally Abused: No  ?? Physically Abused: No  ?? Sexually Abused: No   ? ? ?ROS ? ?General:  Negative for nexplained weight loss, fever ?Skin: Negative for new or changing mole, sore that won't heal ?HEENT: Negative for trouble hearing, trouble seeing, ringing in ears, mouth sores, hoarseness, change in voice, dysphagia. ?CV:  Negative for chest pain, dyspnea, edema, palpitations ?Resp: Negative for cough, dyspnea, hemoptysis ?GI: Negative for nausea, vomiting, diarrhea, constipation, abdominal pain, melena, hematochezia. ?GU: Negative for dysuria, incontinence, urinary hesitance, hematuria, vaginal or penile discharge, polyuria, sexual difficulty, lumps in testicle or breasts ?MSK: Negative for muscle cramps or aches, joint pain or swelling ?Neuro: Negative for headaches, weakness, numbness, dizziness, passing out/fainting ?Psych: Negative for depression, anxiety, memory problems ? ?Objective ? ?Physical Exam ?Vitals:  ? 08/14/21 1018  ?BP: 110/70  ?Pulse: 68  ?Temp: 97.7 ?F (36.5 ?C)  ?SpO2: 98%  ? ? ?BP Readings from Last 3 Encounters:  ?08/14/21 110/70  ?12/07/20 125/80  ?07/21/20 120/80  ? ?Wt Readings from Last 3 Encounters:  ?08/14/21 156 lb 9.6 oz (71 kg)  ?12/07/20 161 lb 12.8 oz (73.4 kg)  ?09/07/20 172 lb (78 kg)  ? ? ?Physical Exam ?Constitutional:   ?   General: She is not in acute distress. ?   Appearance: She is not diaphoretic.  ?HENT:  ?   Head: Normocephalic and atraumatic.  ?Eyes:  ?   Conjunctiva/sclera: Conjunctivae normal.  ?   Pupils: Pupils are equal, round, and reactive to light.  ?Cardiovascular:  ?   Rate and Rhythm: Normal rate and regular rhythm.  ?   Heart sounds: Normal heart sounds.  ?Pulmonary:  ?   Effort: Pulmonary effort is normal.  ?   Breath sounds: Normal breath sounds.  ?Abdominal:  ?   General: Bowel sounds are normal. There is no distension.  ?   Palpations: Abdomen is soft.  ?   Tenderness: There is no abdominal tenderness. There is no guarding or rebound.  ?Musculoskeletal:  ?   Right lower leg: No edema.  ?   Left lower leg: No edema.   ?Lymphadenopathy:  ?   Cervical: No cervical adenopathy.  ?Skin: ?   General: Skin is warm and dry.  ?Neurological:  ?   Mental Status: She is alert.  ?Psychiatric:     ?   Mood and Affect: Mood normal.  ? ? ? ?Assessment/Plan:  ? ?Problem List Items Addressed This Visit   ? ? Essential hypertension  ? Relevant Orders  ? Basic Metabolic Panel (BMET)  ? Hyperlipidemia  ? Osteoporosis  ?  DEXA scan ordered.  She will continue Fosamax.  Discussed a drug holiday after being on this for 5 years.  This was started in early 2019. ?  ?  ? Relevant Orders  ? DG Bone Density  ? Vitamin D (25 hydroxy)  ? Prediabetes  ? Routine general  medical examination at a health care facility - Primary  ?  Physical exam completed.  She will continue healthy diet and exercise.  We will order a DEXA scan.  She will continue Fosamax.  She will have her mammogram completed.  The patient declines tetanus vaccine, flu vaccine, updated COVID booster, Shingrix vaccine, and pneumonia vaccine.  She notes she would be unlikely to want any of these at any point in the future.  This was updated in her health maintenance.  Lab work reviewed with the patient. ?  ?  ? ? ?Return in about 6 months (around 02/14/2022) for Hypertension, labs 2 days prior. ? ?This visit occurred during the SARS-CoV-2 public health emergency.  Safety protocols were in place, including screening questions prior to the visit, additional usage of staff PPE, and extensive cleaning of exam room while observing appropriate contact time as indicated for disinfecting solutions.  ? ? ?Tommi Rumps, MD ?[place] ? ?

## 2021-08-14 NOTE — Assessment & Plan Note (Signed)
Physical exam completed.  She will continue healthy diet and exercise.  We will order a DEXA scan.  She will continue Fosamax.  She will have her mammogram completed.  The patient declines tetanus vaccine, flu vaccine, updated COVID booster, Shingrix vaccine, and pneumonia vaccine.  She notes she would be unlikely to want any of these at any point in the future.  This was updated in her health maintenance.  Lab work reviewed with the patient. ?

## 2021-08-14 NOTE — Assessment & Plan Note (Signed)
DEXA scan ordered.  She will continue Fosamax.  Discussed a drug holiday after being on this for 5 years.  This was started in early 2019. ?

## 2021-09-05 ENCOUNTER — Encounter (INDEPENDENT_AMBULATORY_CARE_PROVIDER_SITE_OTHER): Payer: Medicare Other | Admitting: Ophthalmology

## 2021-09-05 ENCOUNTER — Other Ambulatory Visit: Payer: Self-pay

## 2021-09-05 DIAGNOSIS — H353231 Exudative age-related macular degeneration, bilateral, with active choroidal neovascularization: Secondary | ICD-10-CM

## 2021-09-05 DIAGNOSIS — H43813 Vitreous degeneration, bilateral: Secondary | ICD-10-CM

## 2021-09-05 DIAGNOSIS — I10 Essential (primary) hypertension: Secondary | ICD-10-CM | POA: Diagnosis not present

## 2021-09-05 DIAGNOSIS — H35033 Hypertensive retinopathy, bilateral: Secondary | ICD-10-CM

## 2021-09-10 ENCOUNTER — Ambulatory Visit: Payer: Medicare Other

## 2021-09-26 ENCOUNTER — Telehealth: Payer: Self-pay | Admitting: Family Medicine

## 2021-09-26 NOTE — Telephone Encounter (Signed)
Copied from [place] [id number]. Topic: Medicare AWV ?>> Sep 26, 2021 10:22 AM Harris-Coley, Hannah Beat wrote: ?Reason for CRM: Left message for patient to schedule Annual Wellness Visit.  Please schedule with Nurse Health Advisor Denisa O'Brien-Blaney, LPN at [facility].  Please call [phone] ask for Juliann Pulse ?

## 2021-10-03 ENCOUNTER — Encounter (INDEPENDENT_AMBULATORY_CARE_PROVIDER_SITE_OTHER): Payer: Medicare Other | Admitting: Ophthalmology

## 2021-10-03 DIAGNOSIS — I10 Essential (primary) hypertension: Secondary | ICD-10-CM

## 2021-10-03 DIAGNOSIS — H35033 Hypertensive retinopathy, bilateral: Secondary | ICD-10-CM

## 2021-10-03 DIAGNOSIS — H353231 Exudative age-related macular degeneration, bilateral, with active choroidal neovascularization: Secondary | ICD-10-CM

## 2021-10-03 DIAGNOSIS — H43813 Vitreous degeneration, bilateral: Secondary | ICD-10-CM | POA: Diagnosis not present

## 2021-10-31 ENCOUNTER — Encounter (INDEPENDENT_AMBULATORY_CARE_PROVIDER_SITE_OTHER): Payer: Medicare Other | Admitting: Ophthalmology

## 2021-10-31 DIAGNOSIS — H353231 Exudative age-related macular degeneration, bilateral, with active choroidal neovascularization: Secondary | ICD-10-CM | POA: Diagnosis not present

## 2021-10-31 DIAGNOSIS — H43813 Vitreous degeneration, bilateral: Secondary | ICD-10-CM

## 2021-10-31 DIAGNOSIS — H35033 Hypertensive retinopathy, bilateral: Secondary | ICD-10-CM

## 2021-10-31 DIAGNOSIS — I10 Essential (primary) hypertension: Secondary | ICD-10-CM

## 2021-11-08 ENCOUNTER — Telehealth: Payer: Self-pay | Admitting: Family Medicine

## 2021-11-08 NOTE — Telephone Encounter (Signed)
Copied from [place] [phone]. Topic: Medicare AWV >> Nov 08, 2021  1:47 PM Harris-Coley, Hannah Beat wrote: Reason for CRM: Left message for patient to schedule Annual Wellness Visit.  Please schedule with Nurse Health Advisor Denisa O'Brien-Blaney, LPN at [facility].  Please call [phone] ask for [facility]

## 2021-12-04 ENCOUNTER — Encounter (INDEPENDENT_AMBULATORY_CARE_PROVIDER_SITE_OTHER): Payer: Medicare Other | Admitting: Ophthalmology

## 2021-12-04 DIAGNOSIS — H43813 Vitreous degeneration, bilateral: Secondary | ICD-10-CM | POA: Diagnosis not present

## 2021-12-04 DIAGNOSIS — I10 Essential (primary) hypertension: Secondary | ICD-10-CM | POA: Diagnosis not present

## 2021-12-04 DIAGNOSIS — H353231 Exudative age-related macular degeneration, bilateral, with active choroidal neovascularization: Secondary | ICD-10-CM | POA: Diagnosis not present

## 2021-12-04 DIAGNOSIS — H35033 Hypertensive retinopathy, bilateral: Secondary | ICD-10-CM

## 2022-01-01 ENCOUNTER — Telehealth: Payer: Self-pay | Admitting: Family Medicine

## 2022-01-01 NOTE — Telephone Encounter (Signed)
Copied from [place] [phone]. Topic: Medicare AWV >> Jan 01, 2022  2:29 PM Devoria Glassing wrote: Reason for CRM: Left message for patient to schedule Annual Wellness Visit.  Please schedule with Nurse Health Advisor Denisa O'Brien-Blaney, LPN at [facility]. This appt can be telephone or office visit.  Please call [phone] ask for [facility]

## 2022-01-15 ENCOUNTER — Encounter (INDEPENDENT_AMBULATORY_CARE_PROVIDER_SITE_OTHER): Payer: Medicare Other | Admitting: Ophthalmology

## 2022-01-15 DIAGNOSIS — I10 Essential (primary) hypertension: Secondary | ICD-10-CM

## 2022-01-15 DIAGNOSIS — H43813 Vitreous degeneration, bilateral: Secondary | ICD-10-CM

## 2022-01-15 DIAGNOSIS — H35033 Hypertensive retinopathy, bilateral: Secondary | ICD-10-CM | POA: Diagnosis not present

## 2022-01-15 DIAGNOSIS — H353231 Exudative age-related macular degeneration, bilateral, with active choroidal neovascularization: Secondary | ICD-10-CM | POA: Diagnosis not present

## 2022-02-13 ENCOUNTER — Other Ambulatory Visit: Payer: Medicare Other

## 2022-02-18 ENCOUNTER — Telehealth: Payer: Self-pay | Admitting: Family Medicine

## 2022-02-18 ENCOUNTER — Ambulatory Visit: Payer: Medicare Other | Admitting: Family Medicine

## 2022-02-18 NOTE — Telephone Encounter (Signed)
Copied from [place] [phone]. Topic: Medicare AWV >> Feb 18, 2022  1:41 PM Devoria Glassing wrote: Reason for CRM: Left message for patient to schedule Annual Wellness Visit.  Please schedule with Nurse Health Advisor Denisa O'Brien-Blaney, LPN at [facility]. This appt can be telephone or office visit.  Please call [phone] ask for [facility]

## 2022-02-18 NOTE — Telephone Encounter (Signed)
Copied from [place] [phone]. Topic: Medicare AWV >> Feb 18, 2022  1:41 PM Devoria Glassing wrote: Reason for CRM: Left message for patient to schedule Annual Wellness Visit.  Please schedule with Nurse Health Advisor Denisa O'Brien-Blaney, LPN at [facility]. This appt can be telephone or office visit.  Please call [phone] ask for [facility]

## 2022-02-26 ENCOUNTER — Encounter (INDEPENDENT_AMBULATORY_CARE_PROVIDER_SITE_OTHER): Payer: Medicare Other | Admitting: Ophthalmology

## 2022-02-26 DIAGNOSIS — H35033 Hypertensive retinopathy, bilateral: Secondary | ICD-10-CM | POA: Diagnosis not present

## 2022-02-26 DIAGNOSIS — I10 Essential (primary) hypertension: Secondary | ICD-10-CM

## 2022-02-26 DIAGNOSIS — H353231 Exudative age-related macular degeneration, bilateral, with active choroidal neovascularization: Secondary | ICD-10-CM

## 2022-02-26 DIAGNOSIS — H43813 Vitreous degeneration, bilateral: Secondary | ICD-10-CM

## 2022-03-02 ENCOUNTER — Other Ambulatory Visit: Payer: Self-pay | Admitting: Family Medicine

## 2022-03-02 DIAGNOSIS — I10 Essential (primary) hypertension: Secondary | ICD-10-CM

## 2022-04-02 ENCOUNTER — Telehealth: Payer: Self-pay | Admitting: Family Medicine

## 2022-04-02 NOTE — Telephone Encounter (Signed)
Spoke with patient she declined AWV do not call  

## 2022-04-09 ENCOUNTER — Encounter (INDEPENDENT_AMBULATORY_CARE_PROVIDER_SITE_OTHER): Payer: Medicare Other | Admitting: Ophthalmology

## 2022-04-09 DIAGNOSIS — H43813 Vitreous degeneration, bilateral: Secondary | ICD-10-CM | POA: Diagnosis not present

## 2022-04-09 DIAGNOSIS — H35033 Hypertensive retinopathy, bilateral: Secondary | ICD-10-CM | POA: Diagnosis not present

## 2022-04-09 DIAGNOSIS — H353231 Exudative age-related macular degeneration, bilateral, with active choroidal neovascularization: Secondary | ICD-10-CM | POA: Diagnosis not present

## 2022-04-09 DIAGNOSIS — I10 Essential (primary) hypertension: Secondary | ICD-10-CM

## 2022-05-07 ENCOUNTER — Encounter: Payer: Self-pay | Admitting: Family Medicine

## 2022-05-21 ENCOUNTER — Encounter (INDEPENDENT_AMBULATORY_CARE_PROVIDER_SITE_OTHER): Payer: Medicare Other | Admitting: Ophthalmology

## 2022-05-21 DIAGNOSIS — H35033 Hypertensive retinopathy, bilateral: Secondary | ICD-10-CM

## 2022-05-21 DIAGNOSIS — H353231 Exudative age-related macular degeneration, bilateral, with active choroidal neovascularization: Secondary | ICD-10-CM | POA: Diagnosis not present

## 2022-05-21 DIAGNOSIS — I10 Essential (primary) hypertension: Secondary | ICD-10-CM

## 2022-05-21 DIAGNOSIS — H43813 Vitreous degeneration, bilateral: Secondary | ICD-10-CM

## 2022-05-28 ENCOUNTER — Other Ambulatory Visit: Payer: Self-pay | Admitting: Family Medicine

## 2022-07-02 ENCOUNTER — Encounter (INDEPENDENT_AMBULATORY_CARE_PROVIDER_SITE_OTHER): Payer: Medicare Other | Admitting: Ophthalmology

## 2022-07-02 DIAGNOSIS — H43813 Vitreous degeneration, bilateral: Secondary | ICD-10-CM

## 2022-07-02 DIAGNOSIS — H35033 Hypertensive retinopathy, bilateral: Secondary | ICD-10-CM

## 2022-07-02 DIAGNOSIS — H353231 Exudative age-related macular degeneration, bilateral, with active choroidal neovascularization: Secondary | ICD-10-CM | POA: Diagnosis not present

## 2022-07-02 DIAGNOSIS — I10 Essential (primary) hypertension: Secondary | ICD-10-CM | POA: Diagnosis not present

## 2022-07-09 ENCOUNTER — Other Ambulatory Visit: Payer: Self-pay | Admitting: Family Medicine

## 2022-07-09 ENCOUNTER — Encounter (INDEPENDENT_AMBULATORY_CARE_PROVIDER_SITE_OTHER): Payer: Medicare Other | Admitting: Ophthalmology

## 2022-08-12 ENCOUNTER — Other Ambulatory Visit: Payer: Self-pay | Admitting: Family Medicine

## 2022-08-12 DIAGNOSIS — R7303 Prediabetes: Secondary | ICD-10-CM

## 2022-08-12 DIAGNOSIS — E785 Hyperlipidemia, unspecified: Secondary | ICD-10-CM

## 2022-08-12 DIAGNOSIS — M810 Age-related osteoporosis without current pathological fracture: Secondary | ICD-10-CM

## 2022-08-12 DIAGNOSIS — Z130 Encounter for screening for diseases of the blood and blood-forming organs and certain disorders involving the immune mechanism: Secondary | ICD-10-CM

## 2022-08-13 ENCOUNTER — Encounter (INDEPENDENT_AMBULATORY_CARE_PROVIDER_SITE_OTHER): Payer: Medicare Other | Admitting: Ophthalmology

## 2022-08-13 DIAGNOSIS — H353231 Exudative age-related macular degeneration, bilateral, with active choroidal neovascularization: Secondary | ICD-10-CM | POA: Diagnosis not present

## 2022-08-13 DIAGNOSIS — H35033 Hypertensive retinopathy, bilateral: Secondary | ICD-10-CM | POA: Diagnosis not present

## 2022-08-13 DIAGNOSIS — H43813 Vitreous degeneration, bilateral: Secondary | ICD-10-CM | POA: Diagnosis not present

## 2022-08-13 DIAGNOSIS — I10 Essential (primary) hypertension: Secondary | ICD-10-CM

## 2022-08-19 ENCOUNTER — Other Ambulatory Visit (INDEPENDENT_AMBULATORY_CARE_PROVIDER_SITE_OTHER): Payer: Medicare Other

## 2022-08-19 ENCOUNTER — Other Ambulatory Visit: Payer: Medicare Other

## 2022-08-19 DIAGNOSIS — R7303 Prediabetes: Secondary | ICD-10-CM | POA: Diagnosis not present

## 2022-08-19 DIAGNOSIS — E785 Hyperlipidemia, unspecified: Secondary | ICD-10-CM

## 2022-08-19 DIAGNOSIS — M810 Age-related osteoporosis without current pathological fracture: Secondary | ICD-10-CM

## 2022-08-19 DIAGNOSIS — Z130 Encounter for screening for diseases of the blood and blood-forming organs and certain disorders involving the immune mechanism: Secondary | ICD-10-CM

## 2022-08-19 LAB — COMPREHENSIVE METABOLIC PANEL
ALT: 20 U/L (ref 0–35)
AST: 8 U/L (ref 0–37)
Albumin: 3.9 g/dL (ref 3.5–5.2)
Alkaline Phosphatase: 66 U/L (ref 39–117)
BUN: 13 mg/dL (ref 6–23)
CO2: 28 mEq/L (ref 19–32)
Calcium: 9.2 mg/dL (ref 8.4–10.5)
Chloride: 106 mEq/L (ref 96–112)
Creatinine, Ser: 0.75 mg/dL (ref 0.40–1.20)
GFR: 76.85 mL/min (ref >60.00)
Glucose, Bld: 105 mg/dL — ABNORMAL HIGH (ref 70–99)
Potassium: 4.3 mEq/L (ref 3.5–5.1)
Sodium: 141 mEq/L (ref 135–145)
Total Bilirubin: 0.4 mg/dL (ref 0.2–1.2)
Total Protein: 7.1 g/dL (ref 6.0–8.3)

## 2022-08-19 LAB — CBC
HCT: 43.8 % (ref 36.0–46.0)
Hemoglobin: 14.7 g/dL (ref 12.0–15.0)
MCHC: 33.5 g/dL (ref 30.0–36.0)
MCV: 90.8 fl (ref 78.0–100.0)
Platelets: 293 10*3/uL (ref 150.0–400.0)
RBC: 4.82 Mil/uL (ref 3.87–5.11)
RDW: 13.4 % (ref 11.5–15.5)
WBC: 6.6 10*3/uL (ref 4.0–10.5)

## 2022-08-19 LAB — LIPID PANEL
Cholesterol: 160 mg/dL (ref 0–200)
HDL: 48.2 mg/dL (ref >39.00)
LDL Cholesterol: 74 mg/dL (ref 0–99)
NonHDL: 111.41
Total CHOL/HDL Ratio: 3
Triglycerides: 188 mg/dL — ABNORMAL HIGH (ref 0.0–149.0)
VLDL: 37.6 mg/dL (ref 0.0–40.0)

## 2022-08-19 LAB — HEMOGLOBIN A1C: Hgb A1c MFr Bld: 6.1 % (ref 4.6–6.5)

## 2022-08-19 LAB — VITAMIN D 25 HYDROXY (VIT D DEFICIENCY, FRACTURES): VITD: 34.09 ng/mL (ref 30.00–100.00)

## 2022-08-21 ENCOUNTER — Ambulatory Visit (INDEPENDENT_AMBULATORY_CARE_PROVIDER_SITE_OTHER): Payer: Medicare Other

## 2022-08-21 ENCOUNTER — Encounter: Payer: Self-pay | Admitting: Family Medicine

## 2022-08-21 ENCOUNTER — Ambulatory Visit (INDEPENDENT_AMBULATORY_CARE_PROVIDER_SITE_OTHER): Payer: Medicare Other | Admitting: Family Medicine

## 2022-08-21 VITALS — BP 118/78 | HR 78 | Temp 97.4°F | Ht 67.0 in | Wt 164.4 lb

## 2022-08-21 DIAGNOSIS — M898X6 Other specified disorders of bone, lower leg: Secondary | ICD-10-CM

## 2022-08-21 DIAGNOSIS — R143 Flatulence: Secondary | ICD-10-CM | POA: Diagnosis not present

## 2022-08-21 DIAGNOSIS — G8929 Other chronic pain: Secondary | ICD-10-CM

## 2022-08-21 DIAGNOSIS — M810 Age-related osteoporosis without current pathological fracture: Secondary | ICD-10-CM | POA: Diagnosis not present

## 2022-08-21 DIAGNOSIS — M25511 Pain in right shoulder: Secondary | ICD-10-CM

## 2022-08-21 DIAGNOSIS — M25519 Pain in unspecified shoulder: Secondary | ICD-10-CM | POA: Insufficient documentation

## 2022-08-21 DIAGNOSIS — Z0001 Encounter for general adult medical examination with abnormal findings: Secondary | ICD-10-CM

## 2022-08-21 NOTE — Patient Instructions (Signed)
Nice to see you. You can try the exercises for your shoulders to see if this is beneficial. We will call you with your x-ray result. Please continue with healthy diet and increase your exercise some.

## 2022-08-21 NOTE — Assessment & Plan Note (Signed)
Physical exam completed.  Encouraged healthy diet and exercise.  Lab work was reviewed.  We discussed her A1c being in the prediabetic range.  We discussed her triglycerides being minimally elevated.  Discussed diet and exercise changes would help with those things.  She will have her mammogram done in April.  I will order a bone density scan to be done in April as well.  She declines all vaccines.  She understands the purpose of the vaccines.

## 2022-08-21 NOTE — Assessment & Plan Note (Signed)
Patient with chronic right shoulder pain and more recent left shoulder pain.  This could represent arthritis or a rotator cuff issue.  She was given exercises to complete at home.  If not improving we could have her see orthopedics.

## 2022-08-21 NOTE — Progress Notes (Signed)
Natasha Rumps, MD Phone: [phone]  Natasha Mendoza is a 77 y.o. female who presents today for CPE.  Diet: good diet, no fried or sweet foods, no soda or sweet tea Exercise: walking video 3x/week Mammogram: due in April, has through [facility] Family history-  Colon cancer: no  Breast cancer: paternal aunt  Ovarian cancer: no Menses: postmenopausal Vaccines-   Flu: declines  Tetanus: declines  Shingles: declines  COVID19: declines further vaccines  Pneumonia: declines  RSV: declines Hep C Screening: declined in the past Tobacco use: no Alcohol use: 1 glass of wine per week Illicit Drug use: no Dentist: dentures Ophthalmology: yes  Shoulder pain: Patient notes right shoulder pain that started last year.  Initially it hurt to abduct it though it progressively got better.  Now it hurts when she picks certain things up.  She also notes her left shoulder has been hurting for a couple of weeks if she moves it a certain way.  Right tibia pain: Patient notes for some time now she has had a jabbing pain down her right anterior tibia.  It keeps her from sleeping.  Her knee does not hurt.  Her hip does not hurt.  She notes no back pain.  She notes no pain above her knee.  She notes no injury.  She does note it has improved some.  Flatulence: Patient notes she passes lots of gas.  She recently started on MiraLAX and notes that has helped.  This is also help firm up her stools.   Active Ambulatory Problems    Diagnosis Date Noted   Hyperlipidemia 05/26/2007   Constipation 08/30/2008   Prediabetes 07/19/2008   Osteoporosis 01/04/2011   DOE (dyspnea on exertion) 04/05/2016   Hot flashes 04/05/2016   Essential hypertension 06/20/2017   Overweight 06/20/2017   Skin lesion 06/20/2017   Macular degeneration of both eyes 07/01/2018   Hemorrhoids 04/28/2020   Encounter for general adult medical examination with abnormal findings 07/21/2020   History of COVID-19 12/07/2020   Tibial  pain 08/21/2022   Shoulder pain 08/21/2022   Flatulence 08/21/2022   Resolved Ambulatory Problems    Diagnosis Date Noted   MENOPAUSAL SYNDROME 05/26/2007   CONTACT DERMATITIS 12/06/2008   Cough 08/17/2007   URINALYSIS, ABNORMAL 05/27/2007   Epigastric abdominal pain 03/21/2011   Flank pain 03/21/2011   Elevated BP 03/20/2015   Cough 11/23/2015   Acute bronchitis due to COVID-19 virus 11/06/2020   Past Medical History:  Diagnosis Date   Constipation    Hemorrhoids    Hypertension     Family History  Problem Relation Age of Onset   Heart attack Father 20       massive   Hyperlipidemia Father    Stroke Mother    Cancer Paternal Aunt        breast   Diabetes Neg Hx    Colon cancer Neg Hx     Social History   Socioeconomic History   Marital status: Married    Spouse name: Not on file   Number of children: 3   Years of education: Not on file   Highest education level: Not on file  Occupational History   Occupation: Retired   Tobacco Use   Smoking status: Never   Smokeless tobacco: Never  Vaping Use   Vaping Use: Never used  Substance and Sexual Activity   Alcohol use: Yes    Alcohol/week: 1.0 standard drink of alcohol    Types: 1 Glasses of wine per week  Comment: occasionally   Drug use: No   Sexual activity: Not Currently  Other Topics Concern   Not on file  Social History Narrative   Exercise: elliptical 4-5 days a week at [facility].    Healthy eating habits, no fried foods.   Has HCPOA: husband or daughter, Has living will. Full code (Reviewed 2015)   2 caffeine drinks daily    Social Determinants of Health   Financial Resource Strain: Low Risk  (09/07/2020)   Overall Financial Resource Strain (CARDIA)    Difficulty of Paying Living Expenses: Not hard at all  Food Insecurity: No Food Insecurity (09/07/2020)   Hunger Vital Sign    Worried About Running Out of Food in the Last Year: Never true    Ran Out of Food in the Last Year: Never true   Transportation Needs: No Transportation Needs (09/07/2020)   PRAPARE - Hydrologist (Medical): No    Lack of Transportation (Non-Medical): No  Physical Activity: Insufficiently Active (09/07/2020)   Exercise Vital Sign    Days of Exercise per Week: 3 days    Minutes of Exercise per Session: 30 min  Stress: No Stress Concern Present (09/07/2020)   [place]    Feeling of Stress : Not at all  Social Connections: Unknown (09/07/2020)   Social Connection and Isolation Panel [NHANES]    Frequency of Communication with Friends and Family: Not on file    Frequency of Social Gatherings with Friends and Family: Not on file    Attends Religious Services: Not on file    Active Member of Clubs or Organizations: Not on file    Attends Archivist Meetings: Not on file    Marital Status: Married  Intimate Partner Violence: Not At Risk (09/07/2020)   Humiliation, Afraid, Rape, and Kick questionnaire    Fear of Current or Ex-Partner: No    Emotionally Abused: No    Physically Abused: No    Sexually Abused: No    ROS  General:  Negative for nexplained weight loss, fever Skin: Negative for new or changing mole, sore that won't heal HEENT: Negative for trouble hearing, trouble seeing, ringing in ears, mouth sores, hoarseness, change in voice, dysphagia. CV:  Negative for chest pain, dyspnea, edema, palpitations Resp: Negative for cough, dyspnea, hemoptysis GI: Negative for nausea, vomiting, diarrhea, constipation, abdominal pain, melena, hematochezia. GU: Negative for dysuria, incontinence, urinary hesitance, hematuria, vaginal or penile discharge, polyuria, sexual difficulty, lumps in testicle or breasts MSK: Positive for joint pain, negative for muscle cramps or aches joint swelling Neuro: Negative for headaches, weakness, numbness, dizziness, passing out/fainting Psych: Negative for depression,  anxiety, memory problems  Objective  Physical Exam Vitals:   08/21/22 1335  BP: 118/78  Pulse: 78  Temp: (!) 97.4 F (36.3 C)  SpO2: 97%    BP Readings from Last 3 Encounters:  08/21/22 118/78  08/14/21 110/70  12/07/20 125/80   Wt Readings from Last 3 Encounters:  08/21/22 164 lb 6.4 oz (74.6 kg)  08/14/21 156 lb 9.6 oz (71 kg)  12/07/20 161 lb 12.8 oz (73.4 kg)    Physical Exam Constitutional:      General: She is not in acute distress.    Appearance: She is not diaphoretic.  HENT:     Head: Normocephalic and atraumatic.  Cardiovascular:     Rate and Rhythm: Normal rate and regular rhythm.     Heart sounds: Normal heart  sounds.  Pulmonary:     Effort: Pulmonary effort is normal.     Breath sounds: Normal breath sounds.  Abdominal:     General: Bowel sounds are normal. There is no distension.     Palpations: Abdomen is soft.     Tenderness: There is no abdominal tenderness.  Musculoskeletal:     Right lower leg: No edema.     Left lower leg: No edema.     Comments: Right and left shoulders with full range of motion actively and passively with slight discomfort on active abduction of the right shoulder, negative empty can, negative speeds, no tenderness of her right tibia  Lymphadenopathy:     Cervical: No cervical adenopathy.  Skin:    General: Skin is warm and dry.  Neurological:     Mental Status: She is alert.  Psychiatric:        Mood and Affect: Mood normal.      Assessment/Plan:   Encounter for general adult medical examination with abnormal findings Assessment & Plan: Physical exam completed.  Encouraged healthy diet and exercise.  Lab work was reviewed.  We discussed her A1c being in the prediabetic range.  We discussed her triglycerides being minimally elevated.  Discussed diet and exercise changes would help with those things.  She will have her mammogram done in April.  I will order a bone density scan to be done in April as well.  She declines  all vaccines.  She understands the purpose of the vaccines.   Tibial pain Assessment & Plan: Undetermined cause.  We will get an x-ray today to evaluate for underlying bony issues.  Discussed if it is continuing to be an issue over the next week or 2 she should let me know and I will refer to orthopedics.  Orders: -     DG Tibia/Fibula Right; Future  Chronic right shoulder pain Assessment & Plan: Patient with chronic right shoulder pain and more recent left shoulder pain.  This could represent arthritis or a rotator cuff issue.  She was given exercises to complete at home.  If not improving we could have her see orthopedics.   Age-related osteoporosis without current pathological fracture -     DG Bone Density; Future  Flatulence Assessment & Plan: Merril Abbe has been helpful for this and her bowel movements.  She can continue MiraLAX once daily.     Return in about 6 months (around 02/21/2023) for HTN.   Natasha Rumps, MD [place]

## 2022-08-21 NOTE — Assessment & Plan Note (Signed)
MiraLAX has been helpful for this and her bowel movements.  She can continue MiraLAX once daily.

## 2022-08-21 NOTE — Assessment & Plan Note (Signed)
Undetermined cause.  We will get an x-ray today to evaluate for underlying bony issues.  Discussed if it is continuing to be an issue over the next week or 2 she should let me know and I will refer to orthopedics.

## 2022-08-22 ENCOUNTER — Other Ambulatory Visit: Payer: Self-pay | Admitting: Family Medicine

## 2022-08-23 ENCOUNTER — Ambulatory Visit: Payer: Medicare Other | Admitting: Obstetrics and Gynecology

## 2022-08-23 ENCOUNTER — Encounter: Payer: Self-pay | Admitting: Obstetrics and Gynecology

## 2022-08-23 VITALS — BP 128/76 | HR 68

## 2022-08-23 DIAGNOSIS — N8110 Cystocele, unspecified: Secondary | ICD-10-CM | POA: Diagnosis not present

## 2022-08-23 DIAGNOSIS — N816 Rectocele: Secondary | ICD-10-CM | POA: Diagnosis not present

## 2022-08-23 NOTE — Progress Notes (Signed)
[facility] Return Visit  SUBJECTIVE  History of Present Illness: Arnette Regis Capurro is a 77 y.o. female seen in follow-up for prolapse. She was last seen about two years ago. She continues to feel her prolapse but overall not bothersome to her- she just wants to make sure nothing has progressed. Denies any issues with incontinence or emptying her bladder.   She has had no changes to her medical history since last visit.   Past Medical History: Patient  has a past medical history of Constipation, Hemorrhoids, Hyperlipidemia, and Hypertension.   Past Surgical History: She  has a past surgical history that includes Cholecystectomy (1992); Right benign breast cyst (1997); Tonsillectomy; and Tubal ligation.   Medications: She has a current medication list which includes the following prescription(s): alendronate, aspirin, cholecalciferol, ciprofloxacin, docusate sodium, ketorolac, losartan, preservision areds 2, polyethylene glycol powder, and simvastatin.   Allergies: Patient is allergic to erythromycin.   Social History: Patient  reports that she has never smoked. She has never used smokeless tobacco. She reports current alcohol use of about 1.0 standard drink of alcohol per week. She reports that she does not use drugs.      OBJECTIVE     Physical Exam: Vitals:   08/23/22 1429  BP: 128/76  Pulse: 68   Gen: No apparent distress, A&O x 3.  Detailed Urogynecologic Evaluation:  Normal external genitalia. Normal urethra. On speculum, normal vaginal mucosa present. Normal appearing cervix. Uterus normal, nontender and no masses on bimanual.   POP-Q  1                                            Aa   1                                           Ba  -6.5                                              C   5                                            Gh  5                                            Pb  8                                            tvl   -2                                             Ap  -2  Bp  -7                                              D      ASSESSMENT AND PLAN    Ms. Phipps is a 77 y.o. with:  1. Prolapse of anterior vaginal wall   2. Prolapse of posterior vaginal wall    - Prolapse similar to last exam.  - Reviewed options of pelvic PT, pessary or surgery. She currently does not feel the need for treatment at this time.   She can return as needed  Jaquita Folds, MD   Time spent: I spent 20 minutes dedicated to the care of this patient on the date of this encounter to include pre-visit review of records, face-to-face time with the patient and post visit documentation.

## 2022-09-20 ENCOUNTER — Other Ambulatory Visit: Payer: Self-pay | Admitting: Family Medicine

## 2022-09-24 ENCOUNTER — Encounter (INDEPENDENT_AMBULATORY_CARE_PROVIDER_SITE_OTHER): Payer: Medicare Other | Admitting: Ophthalmology

## 2022-09-24 DIAGNOSIS — H353231 Exudative age-related macular degeneration, bilateral, with active choroidal neovascularization: Secondary | ICD-10-CM

## 2022-09-24 DIAGNOSIS — H35033 Hypertensive retinopathy, bilateral: Secondary | ICD-10-CM

## 2022-09-24 DIAGNOSIS — I10 Essential (primary) hypertension: Secondary | ICD-10-CM

## 2022-09-24 DIAGNOSIS — H43813 Vitreous degeneration, bilateral: Secondary | ICD-10-CM

## 2022-10-03 ENCOUNTER — Ambulatory Visit: Payer: Medicare Other | Admitting: Nurse Practitioner

## 2022-10-03 ENCOUNTER — Encounter: Payer: Self-pay | Admitting: Nurse Practitioner

## 2022-10-03 VITALS — BP 120/76 | HR 78 | Temp 97.7°F | Ht 67.0 in | Wt 163.0 lb

## 2022-10-03 DIAGNOSIS — R21 Rash and other nonspecific skin eruption: Secondary | ICD-10-CM

## 2022-10-03 MED ORDER — DOXYCYCLINE HYCLATE 100 MG PO TABS: 100.0000 mg | ORAL_TABLET | Freq: Two times a day (BID) | ORAL | 0 refills | Status: AC

## 2022-10-03 MED ORDER — CLOTRIMAZOLE-BETAMETHASONE 1-0.05 % EX CREA: 1.0000 | TOPICAL_CREAM | Freq: Two times a day (BID) | CUTANEOUS | 0 refills | Status: DC

## 2022-10-03 MED ORDER — PREDNISONE 20 MG PO TABS: 20.0000 mg | ORAL_TABLET | Freq: Every day | ORAL | 0 refills | Status: AC

## 2022-10-03 NOTE — Progress Notes (Signed)
Established Patient Office Visit  Subjective:  Patient ID: Natasha Mendoza, female    DOB: Mar 19, 1946  Age: 77 y.o. MRN: [id number]  CC:  Chief Complaint  Patient presents with   Acute Visit    Rash from 9 days ago when working in yard has spread from left arm to right arm, then left leg and now stomach Patient has used calamine lotion and cortisone 10 cream with no relief    HPI  Natasha Mendoza presents for rash that started more than a week ago.  He was working in her yard on most 2 weeks ago and the symptoms started 9 days ago.  The rash initially started on her left arm as a red spot and changed into a patch .  The same thing happened on the right arm.  She has  2-3 erythematous spots on the abdomen and on the leg.  He has been using calamine lotion and cortisone 10 without significant improvement.  HPI   Past Medical History:  Diagnosis Date   Constipation    Hemorrhoids    Hyperlipidemia    Hypertension     Past Surgical History:  Procedure Laterality Date   CHOLECYSTECTOMY  1992   Right benign breast cyst  1997   TONSILLECTOMY     TUBAL LIGATION      Family History  Problem Relation Age of Onset   Heart attack Father 49       massive   Hyperlipidemia Father    Stroke Mother    Cancer Paternal Aunt        breast   Diabetes Neg Hx    Colon cancer Neg Hx     Social History   Socioeconomic History   Marital status: Married    Spouse name: Not on file   Number of children: 3   Years of education: Not on file   Highest education level: Not on file  Occupational History   Occupation: Retired   Tobacco Use   Smoking status: Never   Smokeless tobacco: Never  Vaping Use   Vaping Use: Never used  Substance and Sexual Activity   Alcohol use: Yes    Alcohol/week: 1.0 standard drink of alcohol    Types: 1 Glasses of wine per week    Comment: occasionally   Drug use: No   Sexual activity: Not Currently  Other Topics Concern   Not on file   Social History Narrative   Exercise: elliptical 4-5 days a week at [facility].    Healthy eating habits, no fried foods.   Has HCPOA: husband or daughter, Has living will. Full code (Reviewed 2015)   2 caffeine drinks daily    Social Determinants of Health   Financial Resource Strain: Low Risk  (09/07/2020)   Overall Financial Resource Strain (CARDIA)    Difficulty of Paying Living Expenses: Not hard at all  Food Insecurity: No Food Insecurity (09/07/2020)   Hunger Vital Sign    Worried About Running Out of Food in the Last Year: Never true    Ran Out of Food in the Last Year: Never true  Transportation Needs: No Transportation Needs (09/07/2020)   PRAPARE - Administrator, Civil Service (Medical): No    Lack of Transportation (Non-Medical): No  Physical Activity: Insufficiently Active (09/07/2020)   Exercise Vital Sign    Days of Exercise per Week: 3 days    Minutes of Exercise per Session: 30 min  Stress: No Stress Concern Present (  09/07/2020)   [employer] of Occupational Health - Occupational Stress Questionnaire    Feeling of Stress : Not at all  Social Connections: Unknown (09/07/2020)   Social Connection and Isolation Panel [NHANES]    Frequency of Communication with Friends and Family: Not on file    Frequency of Social Gatherings with Friends and Family: Not on file    Attends Religious Services: Not on file    Active Member of Clubs or Organizations: Not on file    Attends Banker Meetings: Not on file    Marital Status: Married  Intimate Partner Violence: Not At Risk (09/07/2020)   Humiliation, Afraid, Rape, and Kick questionnaire    Fear of Current or Ex-Partner: No    Emotionally Abused: No    Physically Abused: No    Sexually Abused: No     Outpatient Medications Prior to Visit  Medication Sig Dispense Refill   alendronate (FOSAMAX) 70 MG tablet TAKE 1 TABLET EVERY 7 DAYS WITH A FULL GLASS OF WATER ON AN EMPTY STOMACH DO NOT LIE DOWN FOR AT  LEAST 30 MIN 12 tablet 1   aspirin 81 MG tablet Take 81 mg by mouth daily.     cholecalciferol (VITAMIN D3) 25 MCG (1000 UT) tablet Take 2,000 Units by mouth daily.     ciprofloxacin (CILOXAN) 0.3 % ophthalmic solution SMARTSIG:In Eye(s)     Docusate Sodium (COLACE PO) Take 1 tablet by mouth daily.     ketorolac (ACULAR) 0.5 % ophthalmic solution 1 drop 3 (three) times daily.     losartan (COZAAR) 50 MG tablet TAKE ONE TABLET EVERY DAY 90 tablet 3   Multiple Vitamins-Minerals (PRESERVISION AREDS 2) CAPS Take 1 tablet by mouth 2 (two) times a day.      polyethylene glycol powder (GLYCOLAX/MIRALAX) powder Take 17 g by mouth 2 (two) times daily as needed. 850 g 1   simvastatin (ZOCOR) 20 MG tablet TAKE 1 TABLET BY MOUTH AT BEDTIME 90 tablet 3   No facility-administered medications prior to visit.    Allergies  Allergen Reactions   Erythromycin     ROS Review of Systems  Constitutional: Negative.   HENT: Negative.    Respiratory: Negative.    Cardiovascular: Negative.   Skin:  Positive for rash.  Psychiatric/Behavioral: Negative.        Objective:    Physical Exam Constitutional:      Appearance: Normal appearance. She is obese.  HENT:     Head: Normocephalic.     Right Ear: Tympanic membrane normal.     Left Ear: Tympanic membrane normal.     Nose: Nose normal.     Mouth/Throat:     Mouth: Mucous membranes are moist.     Pharynx: Oropharynx is clear.  Eyes:     Extraocular Movements: Extraocular movements intact.     Conjunctiva/sclera: Conjunctivae normal.     Pupils: Pupils are equal, round, and reactive to light.  Cardiovascular:     Rate and Rhythm: Normal rate and regular rhythm.     Pulses: Normal pulses.     Heart sounds: Normal heart sounds.  Pulmonary:     Effort: Pulmonary effort is normal. No respiratory distress.     Breath sounds: Normal breath sounds. No rhonchi.  Abdominal:     General: Bowel sounds are normal.     Palpations: Abdomen is soft. There  is no mass.     Tenderness: There is no abdominal tenderness.     Hernia: No  hernia is present.  Musculoskeletal:        General: Normal range of motion.     Cervical back: Neck supple. No tenderness.  Skin:    Capillary Refill: Capillary refill takes less than 2 seconds.     Findings: Erythema and rash present.          Comments: Rash warm  to touch and erythematous.  Neurological:     General: No focal deficit present.     Mental Status: She is alert and oriented to person, place, and time. Mental status is at baseline.  Psychiatric:        Mood and Affect: Mood normal.        Behavior: Behavior normal.        Thought Content: Thought content normal.        Judgment: Judgment normal.     BP 120/76   Pulse 78   Temp 97.7 F (36.5 C)   Ht 5\' 7"  (1.702 m)   Wt 163 lb (73.9 kg)   SpO2 96%   BMI 25.53 kg/m  Wt Readings from Last 3 Encounters:  10/03/22 163 lb (73.9 kg)  08/21/22 164 lb 6.4 oz (74.6 kg)  08/14/21 156 lb 9.6 oz (71 kg)     Health Maintenance  Topic Date Due   DTaP/Tdap/Td (1 - Tdap) Never done   Medicare Annual Wellness (AWV)  09/07/2021   INFLUENZA VACCINE  01/09/2023   DEXA SCAN  Completed   HPV VACCINES  Aged Out   Pneumonia Vaccine 61+ Years old  Discontinued   COVID-19 Vaccine  Discontinued   Fecal DNA (Cologuard)  Discontinued   Zoster Vaccines- Shingrix  Discontinued    There are no preventive care reminders to display for this patient.  Lab Results  Component Value Date   TSH 3.85 08/30/2008   Lab Results  Component Value Date   WBC 6.6 08/19/2022   HGB 14.7 08/19/2022   HCT 43.8 08/19/2022   MCV 90.8 08/19/2022   PLT 293.0 08/19/2022   Lab Results  Component Value Date   NA 141 08/19/2022   K 4.3 08/19/2022   CO2 28 08/19/2022   GLUCOSE 105 (H) 08/19/2022   BUN 13 08/19/2022   CREATININE 0.75 08/19/2022   BILITOT 0.4 08/19/2022   ALKPHOS 66 08/19/2022   AST 8 08/19/2022   ALT 20 08/19/2022   PROT 7.1 08/19/2022    ALBUMIN 3.9 08/19/2022   CALCIUM 9.2 08/19/2022   ANIONGAP 7 12/09/2018   GFR 76.85 08/19/2022   Lab Results  Component Value Date   CHOL 160 08/19/2022   Lab Results  Component Value Date   HDL 48.20 08/19/2022   Lab Results  Component Value Date   LDLCALC 74 08/19/2022   Lab Results  Component Value Date   TRIG 188.0 (H) 08/19/2022   Lab Results  Component Value Date   CHOLHDL 3 08/19/2022   Lab Results  Component Value Date   HGBA1C 6.1 08/19/2022      Assessment & Plan:  Rash Assessment & Plan: Started on doxycycline 10 mg twice a day for 7 days, prednisone and Lotrisone cream. Keep the rash clean and use cortisone cream twice a day. Follow-up in a week. If no improvement would consult dermatology.   Other orders -     Doxycycline Hyclate; Take 1 tablet (100 mg total) by mouth 2 (two) times daily for 7 days.  Dispense: 14 tablet; Refill: 0 -     predniSONE; Take 1  tablet (20 mg total) by mouth daily with breakfast for 5 days.  Dispense: 5 tablet; Refill: 0 -     Clotrimazole-Betamethasone; Apply 1 Application topically 2 (two) times daily.  Dispense: 30 g; Refill: 0    Follow-up: No follow-ups on file.   Kara Dies, NP

## 2022-10-03 NOTE — Patient Instructions (Addendum)
RX sent to pharmacy. Follow up on 10/08/22

## 2022-10-05 NOTE — Assessment & Plan Note (Signed)
Started on doxycycline 10 mg twice a day for 7 days, prednisone and Lotrisone cream. Keep the rash clean and use cortisone cream twice a day. Follow-up in a week. If no improvement would consult dermatology.

## 2022-10-07 LAB — HM DEXA SCAN

## 2022-10-07 LAB — HM MAMMOGRAPHY

## 2022-10-08 ENCOUNTER — Telehealth: Payer: Self-pay

## 2022-10-08 NOTE — Telephone Encounter (Signed)
-----   Message from Eric G Sonnenberg, MD sent at 10/08/2022 10:57 AM EDT ----- Please let the patient know that her bone density scan revealed osteopenia.  Her T-score is actually slightly worse than it was 2 years ago.  Please ask how long she has been on the Fosamax for.  I believe it has been about 5 years and if it has been 5 years we will need to take a holiday from the drug for about a year. 

## 2022-10-08 NOTE — Telephone Encounter (Signed)
Left message to call back regarding her bone scan results

## 2022-10-10 ENCOUNTER — Telehealth: Payer: Self-pay

## 2022-10-10 NOTE — Telephone Encounter (Signed)
Left message to call the office regarding lab results.

## 2022-10-10 NOTE — Telephone Encounter (Signed)
-----   Message from Glori Luis, MD sent at 10/08/2022 10:57 AM EDT ----- Please let the patient know that her bone density scan revealed osteopenia.  Her T-score is actually slightly worse than it was 2 years ago.  Please ask how long she has been on the Fosamax for.  I believe it has been about 5 years and if it has been 5 years we will need to take a holiday from the drug for about a year.

## 2022-10-11 ENCOUNTER — Telehealth: Payer: Self-pay

## 2022-10-11 NOTE — Telephone Encounter (Signed)
Left another message to call back regarding lab results

## 2022-10-11 NOTE — Telephone Encounter (Signed)
-----   Message from Eric G Sonnenberg, MD sent at 10/08/2022 10:57 AM EDT ----- Please let the patient know that her bone density scan revealed osteopenia.  Her T-score is actually slightly worse than it was 2 years ago.  Please ask how long she has been on the Fosamax for.  I believe it has been about 5 years and if it has been 5 years we will need to take a holiday from the drug for about a year. 

## 2022-10-14 ENCOUNTER — Telehealth: Payer: Self-pay

## 2022-10-14 NOTE — Telephone Encounter (Signed)
-----   Message from Eric G Sonnenberg, MD sent at 10/08/2022 10:57 AM EDT ----- Please let the patient know that her bone density scan revealed osteopenia.  Her T-score is actually slightly worse than it was 2 years ago.  Please ask how long she has been on the Fosamax for.  I believe it has been about 5 years and if it has been 5 years we will need to take a holiday from the drug for about a year. 

## 2022-10-14 NOTE — Telephone Encounter (Signed)
Left another message to call the office back regarding lab results

## 2022-10-16 ENCOUNTER — Encounter: Payer: Self-pay | Admitting: Family Medicine

## 2022-11-05 ENCOUNTER — Encounter (INDEPENDENT_AMBULATORY_CARE_PROVIDER_SITE_OTHER): Payer: Medicare Other | Admitting: Ophthalmology

## 2022-11-05 DIAGNOSIS — H353231 Exudative age-related macular degeneration, bilateral, with active choroidal neovascularization: Secondary | ICD-10-CM

## 2022-11-05 DIAGNOSIS — I10 Essential (primary) hypertension: Secondary | ICD-10-CM | POA: Diagnosis not present

## 2022-11-05 DIAGNOSIS — H43813 Vitreous degeneration, bilateral: Secondary | ICD-10-CM

## 2022-11-05 DIAGNOSIS — H35033 Hypertensive retinopathy, bilateral: Secondary | ICD-10-CM

## 2022-12-17 ENCOUNTER — Encounter (INDEPENDENT_AMBULATORY_CARE_PROVIDER_SITE_OTHER): Payer: Medicare Other | Admitting: Ophthalmology

## 2022-12-17 DIAGNOSIS — H43813 Vitreous degeneration, bilateral: Secondary | ICD-10-CM

## 2022-12-17 DIAGNOSIS — H353231 Exudative age-related macular degeneration, bilateral, with active choroidal neovascularization: Secondary | ICD-10-CM

## 2022-12-17 DIAGNOSIS — H35033 Hypertensive retinopathy, bilateral: Secondary | ICD-10-CM

## 2022-12-17 DIAGNOSIS — I10 Essential (primary) hypertension: Secondary | ICD-10-CM | POA: Diagnosis not present

## 2023-01-28 ENCOUNTER — Encounter (INDEPENDENT_AMBULATORY_CARE_PROVIDER_SITE_OTHER): Payer: Medicare Other | Admitting: Ophthalmology

## 2023-01-28 DIAGNOSIS — H353231 Exudative age-related macular degeneration, bilateral, with active choroidal neovascularization: Secondary | ICD-10-CM

## 2023-01-28 DIAGNOSIS — H35033 Hypertensive retinopathy, bilateral: Secondary | ICD-10-CM | POA: Diagnosis not present

## 2023-01-28 DIAGNOSIS — H43813 Vitreous degeneration, bilateral: Secondary | ICD-10-CM | POA: Diagnosis not present

## 2023-01-28 DIAGNOSIS — I10 Essential (primary) hypertension: Secondary | ICD-10-CM | POA: Diagnosis not present

## 2023-03-11 ENCOUNTER — Encounter (INDEPENDENT_AMBULATORY_CARE_PROVIDER_SITE_OTHER): Payer: Medicare Other | Admitting: Ophthalmology

## 2023-03-11 DIAGNOSIS — H353231 Exudative age-related macular degeneration, bilateral, with active choroidal neovascularization: Secondary | ICD-10-CM

## 2023-03-11 DIAGNOSIS — I10 Essential (primary) hypertension: Secondary | ICD-10-CM | POA: Diagnosis not present

## 2023-03-11 DIAGNOSIS — H43813 Vitreous degeneration, bilateral: Secondary | ICD-10-CM | POA: Diagnosis not present

## 2023-03-11 DIAGNOSIS — H35033 Hypertensive retinopathy, bilateral: Secondary | ICD-10-CM

## 2023-03-25 ENCOUNTER — Other Ambulatory Visit: Payer: Self-pay | Admitting: Family Medicine

## 2023-03-25 DIAGNOSIS — I10 Essential (primary) hypertension: Secondary | ICD-10-CM

## 2023-04-22 ENCOUNTER — Encounter (INDEPENDENT_AMBULATORY_CARE_PROVIDER_SITE_OTHER): Payer: Medicare Other | Admitting: Ophthalmology

## 2023-04-22 DIAGNOSIS — H35033 Hypertensive retinopathy, bilateral: Secondary | ICD-10-CM | POA: Diagnosis not present

## 2023-04-22 DIAGNOSIS — I10 Essential (primary) hypertension: Secondary | ICD-10-CM | POA: Diagnosis not present

## 2023-04-22 DIAGNOSIS — H353231 Exudative age-related macular degeneration, bilateral, with active choroidal neovascularization: Secondary | ICD-10-CM

## 2023-04-22 DIAGNOSIS — H43813 Vitreous degeneration, bilateral: Secondary | ICD-10-CM | POA: Diagnosis not present

## 2023-05-27 ENCOUNTER — Encounter (INDEPENDENT_AMBULATORY_CARE_PROVIDER_SITE_OTHER): Payer: Medicare Other | Admitting: Ophthalmology

## 2023-05-27 DIAGNOSIS — H43813 Vitreous degeneration, bilateral: Secondary | ICD-10-CM | POA: Diagnosis not present

## 2023-05-27 DIAGNOSIS — H35033 Hypertensive retinopathy, bilateral: Secondary | ICD-10-CM

## 2023-05-27 DIAGNOSIS — I10 Essential (primary) hypertension: Secondary | ICD-10-CM

## 2023-05-27 DIAGNOSIS — H353231 Exudative age-related macular degeneration, bilateral, with active choroidal neovascularization: Secondary | ICD-10-CM | POA: Diagnosis not present

## 2023-06-02 ENCOUNTER — Telehealth: Payer: Self-pay | Admitting: Family Medicine

## 2023-06-02 NOTE — Telephone Encounter (Signed)
Patient has been scheduled

## 2023-06-02 NOTE — Telephone Encounter (Signed)
MyChart message from Husbands account:  Dr. Milinda Antis, this message is from [name], [place] wife. I am a patient of Dr. Birdie Sons at [facility] office. Since I have been going there I have had 3 different doctors. Everyone has left that practice and now Birdie Sons is leaving the first of year. I am asking that you please accept me as a patient. I promise not to be as much trouble as Darcel Bayley. Thank you for your consideration. [name]   Response from Dr. Milinda Antis:  Please remove this from her husband's chart and put in hers if possible  I am ok with that  Please put her in sometime in the spring Thanks

## 2023-07-08 ENCOUNTER — Encounter (INDEPENDENT_AMBULATORY_CARE_PROVIDER_SITE_OTHER): Payer: Medicare Other | Admitting: Ophthalmology

## 2023-07-08 DIAGNOSIS — H43813 Vitreous degeneration, bilateral: Secondary | ICD-10-CM

## 2023-07-08 DIAGNOSIS — H353231 Exudative age-related macular degeneration, bilateral, with active choroidal neovascularization: Secondary | ICD-10-CM | POA: Diagnosis not present

## 2023-07-08 DIAGNOSIS — I10 Essential (primary) hypertension: Secondary | ICD-10-CM

## 2023-07-08 DIAGNOSIS — H35033 Hypertensive retinopathy, bilateral: Secondary | ICD-10-CM

## 2023-07-24 ENCOUNTER — Ambulatory Visit: Payer: Medicare Other | Admitting: Obstetrics and Gynecology

## 2023-07-24 ENCOUNTER — Encounter: Payer: Self-pay | Admitting: Obstetrics and Gynecology

## 2023-07-24 VITALS — BP 132/72 | HR 72

## 2023-07-24 DIAGNOSIS — N812 Incomplete uterovaginal prolapse: Secondary | ICD-10-CM

## 2023-07-24 DIAGNOSIS — N952 Postmenopausal atrophic vaginitis: Secondary | ICD-10-CM

## 2023-07-24 DIAGNOSIS — N8110 Cystocele, unspecified: Secondary | ICD-10-CM

## 2023-07-24 DIAGNOSIS — N816 Rectocele: Secondary | ICD-10-CM

## 2023-07-24 MED ORDER — ESTRADIOL 0.1 MG/GM VA CREA: 0.5000 g | TOPICAL_CREAM | VAGINAL | 11 refills | Status: DC

## 2023-07-24 NOTE — Patient Instructions (Signed)
For the estrogen cream use it nightly for the first two weeks. After the first two weeks, go down to twice a week.   Please call if you have any irritation with the pessary.

## 2023-07-24 NOTE — Progress Notes (Signed)
[facility]   Subjective:     Chief Complaint: Follow-up Natasha Mendoza is a 78 y.o. female is here for pessary fitting.)  History of Present Illness: Natasha Mendoza is a 78 y.o. female with stage II pelvic organ prolapse who presents today for a pessary fitting.    Past Medical History: Patient  has a past medical history of Constipation, Hemorrhoids, Hyperlipidemia, and Hypertension.   Past Surgical History: She  has a past surgical history that includes Cholecystectomy (1992); Right benign breast cyst (1997); Tonsillectomy; and Tubal ligation.   Medications: She has a current medication list which includes the following prescription(s): alendronate, aspirin, cholecalciferol, ciprofloxacin, clotrimazole-betamethasone, docusate sodium, estradiol, ketorolac, losartan, preservision areds 2, polyethylene glycol powder, and simvastatin.   Allergies: Patient is allergic to erythromycin.   Social History: Patient  reports that she has never smoked. She has never used smokeless tobacco. She reports current alcohol use of about 1.0 standard drink of alcohol per week. She reports that she does not use drugs.      Objective:    BP 132/72   Pulse 72  Gen: No apparent distress, A&O x 3. Pelvic Exam: Normal external female genitalia; Bartholin's and Skene's glands normal in appearance; urethral meatus with caruncle, no urethral masses or discharge.   Attempted a #4 Marland which was pushed to the vaginal opening with valsalva Attempted a #4 Dish which was pushed to vaginal opening with cough/squat.   A size #5 short stem gellhorn pessary ([id number]) was fitted. It was comfortable, stayed in place with valsalva and was an appropriate size on examination, with one finger fitting between the pessary and the vaginal walls. We tied a string to it and the patient demonstrated proper removal and replacement.    Assessment/Plan:    Assessment: [name] is a 78 y.o.  with stage II pelvic organ prolapse who presents for a pessary fitting. Plan: She was fitted with a # 5short stem gellhorn pessary. She will keep the pessary in place until next visit. She will use estrogen.   Patient has vaginal atrophy on exam. She would benefit from estrogen cream. Patient to use a blueberry sized amount into the vagina. She may use this nightly for 2 weeks and then twice weekly after. We discussed using her finger instead of using the applicator.    Follow-up in 4 weeks for a pessary check or sooner as needed.  All questions were answered.    Selmer Dominion, NP

## 2023-08-12 ENCOUNTER — Encounter (INDEPENDENT_AMBULATORY_CARE_PROVIDER_SITE_OTHER): Payer: Medicare Other | Admitting: Ophthalmology

## 2023-08-12 DIAGNOSIS — H35033 Hypertensive retinopathy, bilateral: Secondary | ICD-10-CM | POA: Diagnosis not present

## 2023-08-12 DIAGNOSIS — H353231 Exudative age-related macular degeneration, bilateral, with active choroidal neovascularization: Secondary | ICD-10-CM | POA: Diagnosis not present

## 2023-08-12 DIAGNOSIS — H43813 Vitreous degeneration, bilateral: Secondary | ICD-10-CM

## 2023-08-12 DIAGNOSIS — I10 Essential (primary) hypertension: Secondary | ICD-10-CM | POA: Diagnosis not present

## 2023-08-16 ENCOUNTER — Encounter: Payer: Self-pay | Admitting: Obstetrics and Gynecology

## 2023-08-18 ENCOUNTER — Ambulatory Visit: Admitting: Obstetrics and Gynecology

## 2023-08-18 ENCOUNTER — Encounter: Payer: Self-pay | Admitting: Obstetrics and Gynecology

## 2023-08-18 VITALS — BP 146/78 | HR 66

## 2023-08-18 DIAGNOSIS — N952 Postmenopausal atrophic vaginitis: Secondary | ICD-10-CM

## 2023-08-18 DIAGNOSIS — N816 Rectocele: Secondary | ICD-10-CM

## 2023-08-18 DIAGNOSIS — N812 Incomplete uterovaginal prolapse: Secondary | ICD-10-CM

## 2023-08-18 DIAGNOSIS — N8110 Cystocele, unspecified: Secondary | ICD-10-CM

## 2023-08-18 NOTE — Progress Notes (Signed)
 [facility]   Subjective:     Chief Complaint:  Chief Complaint  Patient presents with   Follow-up    Natasha Mendoza is a 78 y.o. female is here for pessary issues.   History of Present Illness: Natasha Mendoza is a 78 y.o. female with stage II pelvic organ prolapse who presents for a pessary check. She is using a size #5 short stem gellhorn pessary. The pessary has been working well but she reports her anterior wall is pushing down more. She is using vaginal estrogen. She denies vaginal bleeding.  She also reports she believes she overused her estrogen cream.   Past Medical History: Patient  has a past medical history of Constipation, Hemorrhoids, Hyperlipidemia, and Hypertension.   Past Surgical History: She  has a past surgical history that includes Cholecystectomy (1992); Right benign breast cyst (1997); Tonsillectomy; and Tubal ligation.   Medications: She has a current medication list which includes the following prescription(s): alendronate, aspirin, cholecalciferol, ciprofloxacin, clotrimazole-betamethasone, docusate sodium, estradiol, ketorolac, losartan, preservision areds 2, polyethylene glycol powder, and simvastatin.   Allergies: Patient is allergic to erythromycin.   Social History: Patient  reports that she has never smoked. She has never used smokeless tobacco. She reports current alcohol use of about 1.0 standard drink of alcohol per week. She reports that she does not use drugs.      Objective:    Physical Exam: BP (!) 146/78   Pulse 66  Gen: No apparent distress, A&O x 3. Detailed Urogynecologic Evaluation:  Pelvic Exam: Normal external female genitalia; Bartholin's and Skene's glands normal in appearance; urethral meatus normal in appearance, no urethral masses or discharge. The pessary was noted to be in place, but the anterior wall was noted to be pushing around the pessary. It was removed and cleaned. Speculum exam revealed no  lesions in the vagina. The pessary was replaced with a #6 Shaatz (Lot [id number]). It was comfortable to the patient and fit well. She was able to urinate and valsalva on the toilet without pessary expulsion. She reports it is comfortable.    Assessment/Plan:    Assessment: Ms. Hotz is a 78 y.o. with stage II pelvic organ prolapse here for a pessary check. She is doing well.  Plan: She will keep the pessary in place until next visit. She will continue to use estrogen. She will follow-up in 3 weeks for a pessary check or sooner as needed.  All questions were answered.

## 2023-08-20 ENCOUNTER — Telehealth: Payer: Self-pay | Admitting: Family Medicine

## 2023-08-20 NOTE — Telephone Encounter (Signed)
 Dr Birdie Sons is no longer at this location. Please call the office to schedule a Transfer of Care to either Dr Charlann Lange, Darleen Crocker or Kara Dies, NP. E2C2 please schedule TOC for this patient.   Thank you

## 2023-08-22 ENCOUNTER — Encounter: Payer: Self-pay | Admitting: Obstetrics and Gynecology

## 2023-08-22 NOTE — Telephone Encounter (Signed)
 Patient is scheduled for appt 08/25/23

## 2023-08-25 ENCOUNTER — Ambulatory Visit: Admitting: Obstetrics and Gynecology

## 2023-08-25 VITALS — BP 135/78 | HR 76

## 2023-08-25 DIAGNOSIS — N8110 Cystocele, unspecified: Secondary | ICD-10-CM

## 2023-08-25 DIAGNOSIS — N812 Incomplete uterovaginal prolapse: Secondary | ICD-10-CM

## 2023-08-25 DIAGNOSIS — N816 Rectocele: Secondary | ICD-10-CM

## 2023-08-25 NOTE — Progress Notes (Signed)
 [facility]   Subjective:     Chief Complaint:  Chief Complaint  Patient presents with   Pessary Check    Natasha Mendoza is a 78 y.o. female is here for pessary check.   History of Present Illness: Natasha Mendoza is a 78 y.o. female with stage II pelvic organ prolapse who presents for a pessary check. She is using a size #6 Shaatz pessary. The pessary has been uncomfortable at times and she reports being unable to do her exercises comfortably. She is using vaginal estrogen. She denies vaginal bleeding.  Past Medical History: Patient  has a past medical history of Constipation, Hemorrhoids, Hyperlipidemia, and Hypertension.   Past Surgical History: She  has a past surgical history that includes Cholecystectomy (1992); Right benign breast cyst (1997); Tonsillectomy; and Tubal ligation.   Medications: She has a current medication list which includes the following prescription(s): alendronate, aspirin, cholecalciferol, ciprofloxacin, clotrimazole-betamethasone, docusate sodium, estradiol, ketorolac, losartan, preservision areds 2, polyethylene glycol powder, and simvastatin.   Allergies: Patient is allergic to erythromycin.   Social History: Patient  reports that she has never smoked. She has never used smokeless tobacco. She reports current alcohol use of about 1.0 standard drink of alcohol per week. She reports that she does not use drugs.      Objective:    Physical Exam: BP 135/78   Pulse 76  Gen: No apparent distress, A&O x 3. Detailed Urogynecologic Evaluation:  Pelvic Exam: Normal external female genitalia; Bartholin's and Skene's glands normal in appearance; urethral meatus with caruncle, no urethral masses or discharge. The pessary was noted to be in place. It was removed and cleaned. Patient reported she would prefer the #5 Short stem Gellhorn be put back in for comfort. While the anterior vaginal wall does come down a little more with the Gellhorn  pessary vs. The Shattz, she reports the Gellhorn was much more comfortable. She was able to walk around comfortably and reports she plans to exercise today and see how it feels.   Assessment/Plan:    Assessment: [name] is a 78 y.o. with stage II pelvic organ prolapse here for a pessary check. She is doing well.  Plan: She will keep the pessary in place until next visit. She will continue to use estrogen. She will follow-up in 3 months for a pessary check or sooner as needed.   We briefly discussed that if this pessary was not working well we could consider a cube pessary and see how that worked for her. She is open to this. If the pessaries are not working would recommend a colpocleisis if she were wanting to pursue surgical intervention as she is not interested in being sexually active and it would be the least invasive surgical intervention. She is considering her options.   All questions were answered.

## 2023-08-27 ENCOUNTER — Ambulatory Visit: Payer: Medicare Other | Admitting: Obstetrics and Gynecology

## 2023-08-28 ENCOUNTER — Encounter: Payer: Self-pay | Admitting: Obstetrics and Gynecology

## 2023-09-18 ENCOUNTER — Encounter (INDEPENDENT_AMBULATORY_CARE_PROVIDER_SITE_OTHER): Admitting: Ophthalmology

## 2023-09-18 DIAGNOSIS — I10 Essential (primary) hypertension: Secondary | ICD-10-CM | POA: Diagnosis not present

## 2023-09-18 DIAGNOSIS — H353231 Exudative age-related macular degeneration, bilateral, with active choroidal neovascularization: Secondary | ICD-10-CM

## 2023-09-18 DIAGNOSIS — H35033 Hypertensive retinopathy, bilateral: Secondary | ICD-10-CM

## 2023-09-18 DIAGNOSIS — H43813 Vitreous degeneration, bilateral: Secondary | ICD-10-CM

## 2023-09-19 ENCOUNTER — Encounter: Payer: Self-pay | Admitting: Family Medicine

## 2023-09-21 NOTE — Telephone Encounter (Signed)
 Please let her know I have to see her and discuss medical issues before I can order labs  Thanks

## 2023-10-13 ENCOUNTER — Encounter: Payer: Self-pay | Admitting: Family Medicine

## 2023-10-13 ENCOUNTER — Ambulatory Visit: Payer: Medicare Other | Admitting: Family Medicine

## 2023-10-13 VITALS — BP 136/76 | HR 73 | Temp 98.1°F | Ht 67.0 in | Wt 159.0 lb

## 2023-10-13 DIAGNOSIS — E785 Hyperlipidemia, unspecified: Secondary | ICD-10-CM | POA: Diagnosis not present

## 2023-10-13 DIAGNOSIS — H353230 Exudative age-related macular degeneration, bilateral, stage unspecified: Secondary | ICD-10-CM | POA: Diagnosis not present

## 2023-10-13 DIAGNOSIS — I10 Essential (primary) hypertension: Secondary | ICD-10-CM

## 2023-10-13 DIAGNOSIS — R7303 Prediabetes: Secondary | ICD-10-CM

## 2023-10-13 DIAGNOSIS — M810 Age-related osteoporosis without current pathological fracture: Secondary | ICD-10-CM

## 2023-10-13 DIAGNOSIS — K5900 Constipation, unspecified: Secondary | ICD-10-CM

## 2023-10-13 LAB — HM MAMMOGRAPHY

## 2023-10-13 NOTE — Patient Instructions (Addendum)
 Keep walking  Add some more strength training to your routine, this is important for bone and brain health and can reduce your risk of falls and help your body use insulin properly and regulate weight  Light weights, exercise bands , and internet videos are a good way to start  Yoga (chair or regular), machines , floor exercises or a gym with machines are also good options      To prevent diabetes Try to get most of your carbohydrates from produce (with the exception of white potatoes) and whole grains Eat less bread/pasta/rice/snack foods/cereals/sweets and other items from the middle of the grocery store (processed carbs)  Labs today

## 2023-10-13 NOTE — Assessment & Plan Note (Addendum)
 Per last dexa 09/2022- osteopenia  No falls or fracture Discussed fall prevention, supplements and exercise for bone density   On drug holiday from alendronate  Consider re start in a year

## 2023-10-13 NOTE — Assessment & Plan Note (Signed)
 Lab Results  Component Value Date   HGBA1C 6.1 08/19/2022   HGBA1C 5.8 08/02/2021   HGBA1C 6.1 07/10/2020   Lab today   Has lost weight with diet /exercise disc imp of low glycemic diet and wt loss to prevent DM2  Handouts given

## 2023-10-13 NOTE — Assessment & Plan Note (Signed)
 bp in fair control at this time  BP Readings from Last 1 Encounters:  10/13/23 136/76   No changes needed Most recent labs reviewed  Disc lifstyle change with low sodium diet and exercise  Plan to continue losartan  50 mg daily

## 2023-10-13 NOTE — Assessment & Plan Note (Signed)
 Continues oph treatment with injections Does not drive due to vision

## 2023-10-13 NOTE — Assessment & Plan Note (Signed)
 Disc goals for lipids and reasons to control them Rev last labs with pt Rev low sat fat diet in detail  Labs today  Continues simvastatin  20 mg daily

## 2023-10-13 NOTE — Assessment & Plan Note (Signed)
 Encouraged use of miralax  Fluids and high fiber diet

## 2023-10-13 NOTE — Progress Notes (Signed)
 Subjective:    Patient ID: Natasha Mendoza, female    DOB: [birth year], 78 y.o.   MRN: [id number]  HPI  Wt Readings from Last 3 Encounters:  10/13/23 159 lb (72.1 kg)  10/03/22 163 lb (73.9 kg)  08/21/22 164 lb 6.4 oz (74.6 kg)   24.90 kg/m  Vitals:   10/13/23 1532  BP: 136/76  Pulse: 73  Temp: 98.1 F (36.7 C)  SpO2: 96%    Pt presents to transition care from Dr Lovetta Rucks who left his practice    HTN bp is stable today  No cp or palpitations or headaches or edema  No side effects to medicines  BP Readings from Last 3 Encounters:  10/13/23 136/76  08/25/23 135/78  08/18/23 (!) 146/78    Losartan  50 mg daily   Lab Results  Component Value Date   NA 141 08/19/2022   K 4.3 08/19/2022   CO2 28 08/19/2022   GLUCOSE 105 (H) 08/19/2022   BUN 13 08/19/2022   CREATININE 0.75 08/19/2022   CALCIUM 9.2 08/19/2022   GFR 76.85 08/19/2022   GFRNONAA >60 12/09/2018    Osteoporosis (osteopenia on last dexa 2024)  On alendronate  in past On drug holiday  Lowest T score -2.1  No falls  No fractures  Takes vitamin D  2000 international units daily    Exercise  10-12,000 steps 5 times per week  Also some weights   [facility] video    Hyperlipidemia Lab Results  Component Value Date   CHOL 160 08/19/2022   HDL 48.20 08/19/2022   LDLCALC 74 08/19/2022   TRIG 188.0 (H) 08/19/2022   CHOLHDL 3 08/19/2022  Simvastatin  20 mg daily   Prediabetes Lab Results  Component Value Date   HGBA1C 6.1 08/19/2022   HGBA1C 5.8 08/02/2021   HGBA1C 6.1 07/10/2020   Eats healthy -does well with this  Avoids sweet drinks and added sugar most of the time  Does one spoon of sugar in coffee daily    C/o occational gas and also constipation   Had a mammogram today    Has macular degeneration    Cologuard 07/2020 negative  Good with stopping screening   Does not take vaccines  Never had chicken pox Had first covid vaccine      Patient Active Problem List    Diagnosis Date Noted   Shoulder pain 08/21/2022   Flatulence 08/21/2022   Encounter for general adult medical examination with abnormal findings 07/21/2020   Hemorrhoids 04/28/2020   Macular degeneration of both eyes 07/01/2018   Essential hypertension 06/20/2017   Rash 06/20/2017   DOE (dyspnea on exertion) 04/05/2016   Osteoporosis 01/04/2011   Constipation 08/30/2008   Prediabetes 07/19/2008   Hyperlipidemia 05/26/2007   Past Medical History:  Diagnosis Date   Constipation    Hemorrhoids    Hyperlipidemia    Hypertension    Past Surgical History:  Procedure Laterality Date   CHOLECYSTECTOMY  1992   Right benign breast cyst  1997   TONSILLECTOMY     TUBAL LIGATION     Social History   Tobacco Use   Smoking status: Never   Smokeless tobacco: Never  Vaping Use   Vaping status: Never Used  Substance Use Topics   Alcohol use: Yes    Alcohol/week: 1.0 standard drink of alcohol    Types: 1 Glasses of wine per week    Comment: occasionally   Drug use: No   Family History  Problem Relation Age of Onset  Heart attack Father 77       massive   Hyperlipidemia Father    Stroke Mother    Cancer Paternal Aunt        breast   Diabetes Neg Hx    Colon cancer Neg Hx    Allergies  Allergen Reactions   Erythromycin    Current Outpatient Medications on File Prior to Visit  Medication Sig Dispense Refill   aspirin 81 MG tablet Take 81 mg by mouth daily.     cholecalciferol (VITAMIN D3) 25 MCG (1000 UT) tablet Take 2,000 Units by mouth daily.     ciprofloxacin (CILOXAN) 0.3 % ophthalmic solution SMARTSIG:In Eye(s)     Docusate Sodium (COLACE PO) Take 1 tablet by mouth daily.     ketorolac (ACULAR) 0.5 % ophthalmic solution 1 drop daily as needed.     losartan  (COZAAR ) 50 MG tablet TAKE 1 TABLET BY MOUTH DAILY 90 tablet 3   Multiple Vitamins-Minerals (PRESERVISION AREDS 2) CAPS Take 1 tablet by mouth 2 (two) times a day.      polyethylene glycol powder  (GLYCOLAX /MIRALAX ) powder Take 17 g by mouth 2 (two) times daily as needed. 850 g 1   simvastatin  (ZOCOR ) 20 MG tablet TAKE 1 TABLET BY MOUTH AT BEDTIME 90 tablet 3   No current facility-administered medications on file prior to visit.    Review of Systems  Constitutional:  Negative for activity change, appetite change, fatigue, fever and unexpected weight change.  HENT:  Negative for congestion, ear pain, rhinorrhea, sinus pressure and sore throat.   Eyes:  Positive for visual disturbance. Negative for pain and redness.  Respiratory:  Negative for cough, shortness of breath and wheezing.   Cardiovascular:  Negative for chest pain and palpitations.  Gastrointestinal:  Negative for abdominal pain, blood in stool, constipation and diarrhea.  Endocrine: Negative for polydipsia and polyuria.  Genitourinary:  Negative for dysuria, frequency and urgency.  Musculoskeletal:  Negative for arthralgias, back pain and myalgias.  Skin:  Negative for pallor and rash.  Allergic/Immunologic: Negative for environmental allergies.  Neurological:  Negative for dizziness, syncope and headaches.  Hematological:  Negative for adenopathy. Does not bruise/bleed easily.  Psychiatric/Behavioral:  Negative for decreased concentration and dysphoric mood. The patient is not nervous/anxious.        Objective:   Physical Exam Constitutional:      General: She is not in acute distress.    Appearance: Normal appearance. She is well-developed and normal weight. She is not ill-appearing or diaphoretic.  HENT:     Head: Normocephalic and atraumatic.     Right Ear: Tympanic membrane, ear canal and external ear normal.     Left Ear: Tympanic membrane, ear canal and external ear normal.     Nose: Nose normal. No congestion.     Mouth/Throat:     Mouth: Mucous membranes are moist.     Pharynx: Oropharynx is clear. No posterior oropharyngeal erythema.  Eyes:     General: No scleral icterus.    Extraocular Movements:  Extraocular movements intact.     Conjunctiva/sclera: Conjunctivae normal.     Pupils: Pupils are equal, round, and reactive to light.  Neck:     Thyroid: No thyromegaly.     Vascular: No carotid bruit or JVD.  Cardiovascular:     Rate and Rhythm: Normal rate and regular rhythm.     Pulses: Normal pulses.     Heart sounds: Normal heart sounds.     No gallop.  Pulmonary:  Effort: Pulmonary effort is normal. No respiratory distress.     Breath sounds: Normal breath sounds. No wheezing.     Comments: Good air exch Chest:     Chest wall: No tenderness.  Abdominal:     General: Bowel sounds are normal. There is no distension or abdominal bruit.     Palpations: Abdomen is soft. There is no mass.     Tenderness: There is no abdominal tenderness.     Hernia: No hernia is present.  Musculoskeletal:        General: No tenderness. Normal range of motion.     Cervical back: Normal range of motion and neck supple. No rigidity. No muscular tenderness.     Right lower leg: No edema.     Left lower leg: No edema.     Comments: No kyphosis   Lymphadenopathy:     Cervical: No cervical adenopathy.  Skin:    General: Skin is warm and dry.     Coloration: Skin is not pale.     Findings: No erythema or rash.     Comments: Solar lentigines diffusely   Neurological:     Mental Status: She is alert. Mental status is at baseline.     Cranial Nerves: No cranial nerve deficit.     Motor: No abnormal muscle tone.     Coordination: Coordination normal.     Gait: Gait normal.     Deep Tendon Reflexes: Reflexes are normal and symmetric. Reflexes normal.  Psychiatric:        Mood and Affect: Mood normal.        Cognition and Memory: Cognition and memory normal.           Assessment & Plan:   Problem List Items Addressed This Visit       Cardiovascular and Mediastinum   Essential hypertension - Primary   bp in fair control at this time  BP Readings from Last 1 Encounters:  10/13/23  136/76   No changes needed Most recent labs reviewed  Disc lifstyle change with low sodium diet and exercise  Plan to continue losartan  50 mg daily       Relevant Orders   CBC with Differential/Platelet   Comprehensive metabolic panel with GFR   Lipid panel   TSH     Musculoskeletal and Integument   Osteoporosis   Per last dexa 09/2022- osteopenia  No falls or fracture Discussed fall prevention, supplements and exercise for bone density   On drug holiday from alendronate  Consider re start in a year          Other   Prediabetes   Lab Results  Component Value Date   HGBA1C 6.1 08/19/2022   HGBA1C 5.8 08/02/2021   HGBA1C 6.1 07/10/2020   Lab today   Has lost weight with diet /exercise disc imp of low glycemic diet and wt loss to prevent DM2  Handouts given       Relevant Orders   Hemoglobin A1c   Macular degeneration of both eyes   Continues oph treatment with injections Does not drive due to vision       Hyperlipidemia   Disc goals for lipids and reasons to control them Rev last labs with pt Rev low sat fat diet in detail  Labs today  Continues simvastatin  20 mg daily      Relevant Orders   Comprehensive metabolic panel with GFR   Lipid panel   Constipation   Encouraged use of miralax  Fluids  and high fiber diet

## 2023-10-14 ENCOUNTER — Encounter: Payer: Self-pay | Admitting: Family Medicine

## 2023-10-14 LAB — LIPID PANEL
Cholesterol: 151 mg/dL (ref 0–200)
HDL: 46.1 mg/dL (ref >39.00)
LDL Cholesterol: 75 mg/dL (ref 0–99)
NonHDL: 104.49
Total CHOL/HDL Ratio: 3
Triglycerides: 147 mg/dL (ref 0.0–149.0)
VLDL: 29.4 mg/dL (ref 0.0–40.0)

## 2023-10-14 LAB — COMPREHENSIVE METABOLIC PANEL WITH GFR
ALT: 21 U/L (ref 0–35)
AST: 8 U/L (ref 0–37)
Albumin: 4.2 g/dL (ref 3.5–5.2)
Alkaline Phosphatase: 78 U/L (ref 39–117)
BUN: 15 mg/dL (ref 6–23)
CO2: 29 meq/L (ref 19–32)
Calcium: 9.3 mg/dL (ref 8.4–10.5)
Chloride: 104 meq/L (ref 96–112)
Creatinine, Ser: 0.78 mg/dL (ref 0.40–1.20)
GFR: 72.73 mL/min (ref >60.00)
Glucose, Bld: 96 mg/dL (ref 70–99)
Potassium: 4.1 meq/L (ref 3.5–5.1)
Sodium: 140 meq/L (ref 135–145)
Total Bilirubin: 0.4 mg/dL (ref 0.2–1.2)
Total Protein: 6.9 g/dL (ref 6.0–8.3)

## 2023-10-14 LAB — CBC WITH DIFFERENTIAL/PLATELET
Basophils Absolute: 0.1 10*3/uL (ref 0.0–0.1)
Basophils Relative: 0.9 % (ref 0.0–3.0)
Eosinophils Absolute: 0.2 10*3/uL (ref 0.0–0.7)
Eosinophils Relative: 3.4 % (ref 0.0–5.0)
HCT: 41 % (ref 36.0–46.0)
Hemoglobin: 13.4 g/dL (ref 12.0–15.0)
Lymphocytes Relative: 32.2 % (ref 12.0–46.0)
Lymphs Abs: 2.3 10*3/uL (ref 0.7–4.0)
MCHC: 32.7 g/dL (ref 30.0–36.0)
MCV: 92.3 fl (ref 78.0–100.0)
Monocytes Absolute: 0.9 10*3/uL (ref 0.1–1.0)
Monocytes Relative: 12.4 % — ABNORMAL HIGH (ref 3.0–12.0)
Neutro Abs: 3.6 10*3/uL (ref 1.4–7.7)
Neutrophils Relative %: 51.1 % (ref 43.0–77.0)
Platelets: 297 10*3/uL (ref 150.0–400.0)
RBC: 4.44 Mil/uL (ref 3.87–5.11)
RDW: 13.3 % (ref 11.5–15.5)
WBC: 7 10*3/uL (ref 4.0–10.5)

## 2023-10-14 LAB — HEMOGLOBIN A1C: Hgb A1c MFr Bld: 6.1 % (ref 4.6–6.5)

## 2023-10-14 LAB — TSH: TSH: 4.3 u[IU]/mL (ref 0.35–5.50)

## 2023-10-21 ENCOUNTER — Encounter (INDEPENDENT_AMBULATORY_CARE_PROVIDER_SITE_OTHER): Admitting: Ophthalmology

## 2023-10-21 DIAGNOSIS — I10 Essential (primary) hypertension: Secondary | ICD-10-CM | POA: Diagnosis not present

## 2023-10-21 DIAGNOSIS — H43813 Vitreous degeneration, bilateral: Secondary | ICD-10-CM

## 2023-10-21 DIAGNOSIS — H353231 Exudative age-related macular degeneration, bilateral, with active choroidal neovascularization: Secondary | ICD-10-CM | POA: Diagnosis not present

## 2023-10-21 DIAGNOSIS — H35033 Hypertensive retinopathy, bilateral: Secondary | ICD-10-CM | POA: Diagnosis not present

## 2023-10-27 ENCOUNTER — Ambulatory Visit: Admitting: Family Medicine

## 2023-10-27 ENCOUNTER — Ambulatory Visit: Payer: Self-pay | Admitting: *Deleted

## 2023-10-27 ENCOUNTER — Encounter: Payer: Self-pay | Admitting: Family Medicine

## 2023-10-27 VITALS — BP 135/70 | HR 77 | Temp 98.0°F | Ht 67.0 in | Wt 157.4 lb

## 2023-10-27 DIAGNOSIS — H6120 Impacted cerumen, unspecified ear: Secondary | ICD-10-CM | POA: Insufficient documentation

## 2023-10-27 DIAGNOSIS — H6123 Impacted cerumen, bilateral: Secondary | ICD-10-CM | POA: Diagnosis not present

## 2023-10-27 DIAGNOSIS — H8110 Benign paroxysmal vertigo, unspecified ear: Secondary | ICD-10-CM | POA: Insufficient documentation

## 2023-10-27 DIAGNOSIS — H8111 Benign paroxysmal vertigo, right ear: Secondary | ICD-10-CM | POA: Diagnosis not present

## 2023-10-27 DIAGNOSIS — R0981 Nasal congestion: Secondary | ICD-10-CM | POA: Insufficient documentation

## 2023-10-27 MED ORDER — FLUTICASONE PROPIONATE 50 MCG/ACT NA SUSP: 2.0000 | Freq: Every day | NASAL | 3 refills | Status: DC

## 2023-10-27 MED ORDER — MECLIZINE HCL 12.5 MG PO TABS: ORAL_TABLET | ORAL | 0 refills | Status: DC

## 2023-10-27 NOTE — Assessment & Plan Note (Signed)
 Bilateral  Will not treat in setting of vertigo today   When improved with that-would benefit from trial of debrox and irrigation

## 2023-10-27 NOTE — Patient Instructions (Addendum)
 Try the meclizine  1/2 to 1 pill for dizziness/nausea as needed  Stay hydrated  Change position very slowly / take your time, until you feel better   You may be getting a cold -not sure yet , if so, treat your symptoms  Also could be allergies   Start flonase  nasal spray daily as directed   If any severe symptoms or stroke symptoms go to the ER

## 2023-10-27 NOTE — Progress Notes (Signed)
 Subjective:    Patient ID: Natasha Mendoza, female    DOB: 05/16/1946, 78 y.o.   MRN: [id number]  HPI  Wt Readings from Last 3 Encounters:  10/27/23 157 lb 6 oz (71.4 kg)  10/13/23 159 lb (72.1 kg)  10/03/22 163 lb (73.9 kg)   24.65 kg/m  Vitals:   10/27/23 1116 10/27/23 1139  BP: (!) 144/80 135/70  Pulse: 77   Temp: 98 F (36.7 C)   SpO2: 97%    Pt presents for c/o dizziness   Got up Saturday am- a little dizzy  It went away and had a busy day Yesterday worse again - room is spinning   Worsened by moving head and changing position  Causes nausea    Took a motion sickness medicine and it sedated her (husband's meclizine )  Did not help dizziness and made her sedated   Head pressure/not pain  Sinus pressure - perhaps worse on right side  Pressure feeling in ear  Feels full / buzzing   No colored nasal d/c  A little congested   No fever  Little scratchy throat this am / feels dry  A little hoarse   Not much cough   No change in vision  Has macular degeneration  No slurred speech or facial droop  No focal weakness or sensory change    Took 2 covid tests-neg (yesterday and today)        HTN bp is stable today  No cp or palpitations or headaches or edema  No side effects to medicines  BP Readings from Last 3 Encounters:  10/27/23 135/70  10/13/23 136/76  08/25/23 135/78    Losartan  50 mg daily    Patient Active Problem List   Diagnosis Date Noted   Benign paroxysmal positional vertigo 10/27/2023   Sinus congestion 10/27/2023   Cerumen impaction 10/27/2023   Shoulder pain 08/21/2022   Flatulence 08/21/2022   Encounter for general adult medical examination with abnormal findings 07/21/2020   Hemorrhoids 04/28/2020   Macular degeneration of both eyes 07/01/2018   Essential hypertension 06/20/2017   Rash 06/20/2017   DOE (dyspnea on exertion) 04/05/2016   Osteoporosis 01/04/2011   Constipation 08/30/2008   Prediabetes 07/19/2008    Hyperlipidemia 05/26/2007   Past Medical History:  Diagnosis Date   Constipation    Hemorrhoids    Hyperlipidemia    Hypertension    Past Surgical History:  Procedure Laterality Date   CHOLECYSTECTOMY  1992   Right benign breast cyst  1997   TONSILLECTOMY     TUBAL LIGATION     Social History   Tobacco Use   Smoking status: Never   Smokeless tobacco: Never  Vaping Use   Vaping status: Never Used  Substance Use Topics   Alcohol use: Yes    Alcohol/week: 1.0 standard drink of alcohol    Types: 1 Glasses of wine per week    Comment: occasionally   Drug use: No   Family History  Problem Relation Age of Onset   Heart attack Father 10       massive   Hyperlipidemia Father    Stroke Mother    Cancer Paternal Aunt        breast   Diabetes Neg Hx    Colon cancer Neg Hx    Allergies  Allergen Reactions   Erythromycin    Current Outpatient Medications on File Prior to Visit  Medication Sig Dispense Refill   aspirin 81 MG tablet Take 81 mg  by mouth daily.     cholecalciferol (VITAMIN D3) 25 MCG (1000 UT) tablet Take 2,000 Units by mouth daily.     ciprofloxacin (CILOXAN) 0.3 % ophthalmic solution SMARTSIG:In Eye(s)     Docusate Sodium (COLACE PO) Take 1 tablet by mouth daily.     ketorolac (ACULAR) 0.5 % ophthalmic solution 1 drop daily as needed.     losartan  (COZAAR ) 50 MG tablet TAKE 1 TABLET BY MOUTH DAILY 90 tablet 3   Multiple Vitamins-Minerals (PRESERVISION AREDS 2) CAPS Take 1 tablet by mouth 2 (two) times a day.      polyethylene glycol powder (GLYCOLAX /MIRALAX ) powder Take 17 g by mouth 2 (two) times daily as needed. 850 g 1   simvastatin  (ZOCOR ) 20 MG tablet TAKE 1 TABLET BY MOUTH AT BEDTIME 90 tablet 3   No current facility-administered medications on file prior to visit.    Review of Systems  Constitutional:  Positive for fatigue. Negative for activity change, appetite change, fever and unexpected weight change.  HENT:  Positive for congestion. Negative  for ear discharge, ear pain, rhinorrhea, sinus pressure and sore throat.        Ear pressure   Eyes:  Negative for pain, redness and visual disturbance.  Respiratory:  Negative for cough, shortness of breath and wheezing.   Cardiovascular:  Negative for chest pain and palpitations.  Gastrointestinal:  Negative for abdominal pain, blood in stool, constipation and diarrhea.  Endocrine: Negative for polydipsia and polyuria.  Genitourinary:  Negative for dysuria, frequency and urgency.  Musculoskeletal:  Negative for arthralgias, back pain and myalgias.  Skin:  Negative for pallor and rash.  Allergic/Immunologic: Negative for environmental allergies.  Neurological:  Positive for dizziness. Negative for tremors, seizures, syncope, facial asymmetry, speech difficulty, weakness, numbness and headaches.  Hematological:  Negative for adenopathy. Does not bruise/bleed easily.  Psychiatric/Behavioral:  Negative for decreased concentration and dysphoric mood. The patient is not nervous/anxious.        Objective:   Physical Exam Constitutional:      General: She is not in acute distress.    Appearance: Normal appearance. She is well-developed and normal weight. She is not ill-appearing or diaphoretic.  HENT:     Head: Normocephalic and atraumatic.     Right Ear: External ear normal. There is impacted cerumen.     Left Ear: External ear normal. There is impacted cerumen.     Nose: Congestion present.     Comments: Boggy narea     Mouth/Throat:     Pharynx: No oropharyngeal exudate.  Eyes:     General: No scleral icterus.       Right eye: No discharge.        Left eye: No discharge.     Conjunctiva/sclera: Conjunctivae normal.     Pupils: Pupils are equal, round, and reactive to light.     Comments: 2-3 beats of nystagmus on right side  Some dizziness with lateral gaze   Neck:     Thyroid : No thyromegaly.     Vascular: No carotid bruit or JVD.     Trachea: No tracheal deviation.   Cardiovascular:     Rate and Rhythm: Normal rate and regular rhythm.     Heart sounds: Normal heart sounds. No murmur heard. Pulmonary:     Effort: Pulmonary effort is normal. No respiratory distress.     Breath sounds: Normal breath sounds. No wheezing or rales.  Abdominal:     General: Bowel sounds are normal. There is no  distension.     Palpations: Abdomen is soft. There is no mass.     Tenderness: There is no abdominal tenderness.  Musculoskeletal:        General: No tenderness.     Cervical back: Full passive range of motion without pain, normal range of motion and neck supple.  Lymphadenopathy:     Cervical: No cervical adenopathy.  Skin:    General: Skin is warm and dry.     Coloration: Skin is not pale.     Findings: No bruising, lesion or rash.  Neurological:     Mental Status: She is alert and oriented to person, place, and time.     Cranial Nerves: No cranial nerve deficit, dysarthria or facial asymmetry.     Sensory: Sensation is intact. No sensory deficit.     Motor: No weakness, tremor, atrophy, abnormal muscle tone, seizure activity or pronator drift.     Coordination: Romberg sign negative. Coordination normal. Finger-Nose-Finger Test normal.     Gait: Gait normal.     Deep Tendon Reflexes: Reflexes are normal and symmetric. Reflexes normal.     Comments: No focal cerebellar signs   Psychiatric:        Behavior: Behavior normal.        Thought Content: Thought content normal.           Assessment & Plan:   Problem List Items Addressed This Visit       Respiratory   Sinus congestion   Pt c/o ear and sinus fullness (also scratchy throat) With some intermittent vertigo  Possible ETD (cannot see TMs with cerumen)  Prescription flonase  Call back and Er precautions noted in detail today          Nervous and Auditory   Cerumen impaction   Bilateral  Will not treat in setting of vertigo today   When improved with that-would benefit from trial of  debrox and irrigation       Benign paroxysmal positional vertigo - Primary   Several days of intermittent spinning sensation / occational nausea / worsened by change in gaze or position  No other neuro symptoms Does have some early uri/allergy symptoms with head fullness  On exam- nystagmus on the right  No neuro deficits   Will treat with low dose meclizine  (high dose was too sedating) prn  Instructed to change position slowly  Keep hydrated  Given handout on epley maneuver and vertigo  Flonase  for nasal/sinus congestion and ETD  Update if not starting to improve in a week or if worsening  Call back and Er precautions noted in detail today    Low threshold to image if any stroke symptoms Will monitor closely

## 2023-10-27 NOTE — Telephone Encounter (Signed)
 Has appointment at 11:30  Will see her then  Agree with ER/ UC precautions

## 2023-10-27 NOTE — Assessment & Plan Note (Signed)
 Pt c/o ear and sinus fullness (also scratchy throat) With some intermittent vertigo  Possible ETD (cannot see TMs with cerumen)  Prescription flonase  Call back and Er precautions noted in detail today

## 2023-10-27 NOTE — Telephone Encounter (Signed)
 Copied from CRM [id number]. Topic: Clinical - Red Word Triage >> Oct 27, 2023  7:54 AM Lotus [place] B wrote: [facility] that prompted transfer to Nurse Triage: major dizzyness Reason for Disposition  [1] MODERATE dizziness (e.g., interferes with normal activities) AND [2] has NOT been evaluated by doctor (or NP/PA) for this  (Exception: Dizziness caused by heat exposure, sudden standing, or poor fluid intake.)  Answer Assessment - Initial Assessment Questions 1. DESCRIPTION: "Describe your dizziness."     I got up Sat. Morning dizzy.   It went away.  Sun. Morning it was really bad and present all day.   I can't walk straight.   My head feels full.   I had nausea yesterday but not today.   I can't walk straight.   I just want to sleep all the time.   I took motion medicine so it may be making me sleepy. 2. LIGHTHEADED: "Do you feel lightheaded?" (e.g., somewhat faint, woozy, weak upon standing)     No 3. VERTIGO: "Do you feel like either you or the room is spinning or tilting?" (i.e. vertigo)     Room spinning when I stand up or I turn my head. 4. SEVERITY: "How bad is it?"  "Do you feel like you are going to faint?" "Can you stand and walk?"   - MILD: Feels slightly dizzy, but walking normally.   - MODERATE: Feels unsteady when walking, but not falling; interferes with normal activities (e.g., school, work).   - SEVERE: Unable to walk without falling, or requires assistance to walk without falling; feels like passing out now.      Moderate 5. ONSET:  "When did the dizziness begin?"     Sat. morning 6. AGGRAVATING FACTORS: "Does anything make it worse?" (e.g., standing, change in head position)     Moving my head and standing up 7. HEART RATE: "Can you tell me your heart rate?" "How many beats in 15 seconds?"  (Note: not all patients can do this)       Not asked 8. CAUSE: "What do you think is causing the dizziness?"     I don't know 9. RECURRENT SYMPTOM: "Have you had dizziness before?" If Yes,  ask: "When was the last time?" "What happened that time?"     No 10. OTHER SYMPTOMS: "Do you have any other symptoms?" (e.g., fever, chest pain, vomiting, diarrhea, bleeding)       I have a scratchy throat and hoarseness.   No sinus congestion 11. PREGNANCY: "Is there any chance you are pregnant?" "When was your last menstrual period?"       N/A due to age Covid test negative  Protocols used: Dizziness - Lightheadedness-A-AH  Chief Complaint:  Dizziness Symptoms: Room spinning sensation since Sat. Morning.   Sun. It got really bad. Frequency: Since Sat. Morning when she woke up Pertinent Negatives: Patient denies other symptoms other than a scratchy throat and horseness Disposition: [] ED /[] Urgent Care (no appt availability in office) / [x] Appointment(In office/virtual)/ []  [facility]/ [] Home Care/ [] Refused Recommended Disposition /[] [facility]/ []  Follow-up with PCP Additional Notes: Appt made for today at 10:00 with Dr. Malissa Se.

## 2023-10-27 NOTE — Assessment & Plan Note (Signed)
 Several days of intermittent spinning sensation / occational nausea / worsened by change in gaze or position  No other neuro symptoms Does have some early uri/allergy symptoms with head fullness  On exam- nystagmus on the right  No neuro deficits   Will treat with low dose meclizine  (high dose was too sedating) prn  Instructed to change position slowly  Keep hydrated  Given handout on epley maneuver and vertigo  Flonase  for nasal/sinus congestion and ETD  Update if not starting to improve in a week or if worsening  Call back and Er precautions noted in detail today    Low threshold to image if any stroke symptoms Will monitor closely

## 2023-11-24 ENCOUNTER — Ambulatory Visit: Admitting: Obstetrics and Gynecology

## 2023-11-24 VITALS — BP 152/79 | HR 65

## 2023-11-24 DIAGNOSIS — N812 Incomplete uterovaginal prolapse: Secondary | ICD-10-CM

## 2023-11-24 DIAGNOSIS — N8110 Cystocele, unspecified: Secondary | ICD-10-CM

## 2023-11-24 DIAGNOSIS — N816 Rectocele: Secondary | ICD-10-CM

## 2023-11-24 NOTE — Progress Notes (Signed)
 [facility]   Subjective:     Chief Complaint:  Chief Complaint  Patient presents with   Pessary Check    Natasha Mendoza is a 78 y.o. female is here for pessary check   History of Present Illness: Natasha Mendoza is a 78 y.o. female with stage II pelvic organ prolapse who presents for a pessary check. She is using a size #5 short stem gellhorn pessary. The pessary has been working well but she reports she has been having some increased vaginal discharge. She is not using vaginal estrogen. She denies vaginal bleeding.  Past Medical History: Patient  has a past medical history of Constipation, Hemorrhoids, Hyperlipidemia, and Hypertension.   Past Surgical History: She  has a past surgical history that includes Cholecystectomy (1992); Right benign breast cyst (1997); Tonsillectomy; and Tubal ligation.   Medications: She has a current medication list which includes the following prescription(s): aspirin, cholecalciferol, ciprofloxacin, docusate sodium, fluticasone , ketorolac, losartan , meclizine , preservision areds 2, polyethylene glycol powder, and simvastatin .   Allergies: Patient is allergic to erythromycin.   Social History: Patient  reports that she has never smoked. She has never used smokeless tobacco. She reports current alcohol use of about 1.0 standard drink of alcohol per week. She reports that she does not use drugs.      Objective:    Physical Exam: BP (!) 152/79   Pulse 65  Gen: No apparent distress, A&O x 3. Detailed Urogynecologic Evaluation:  Pelvic Exam: Normal external female genitalia; Bartholin's and Skene's glands normal in appearance; urethral meatus with caruncle, no urethral masses or discharge. The pessary was noted to be in place. It was removed and cleaned. Speculum exam revealed no lesions in the vagina. The pessary was replaced. It was comfortable to the patient and fit well.     Assessment/Plan:    Assessment: Natasha Mendoza  is a 78 y.o. with stage II pelvic organ prolapse here for a pessary check. She is doing well.  Plan: She will keep the pessary in place until next visit. She will continue to use boric acid as needed for vaginal discharge. She will follow-up in 3 months for a pessary check or sooner as needed.

## 2023-11-25 ENCOUNTER — Encounter (INDEPENDENT_AMBULATORY_CARE_PROVIDER_SITE_OTHER): Admitting: Ophthalmology

## 2023-11-25 DIAGNOSIS — H43813 Vitreous degeneration, bilateral: Secondary | ICD-10-CM

## 2023-11-25 DIAGNOSIS — H35033 Hypertensive retinopathy, bilateral: Secondary | ICD-10-CM | POA: Diagnosis not present

## 2023-11-25 DIAGNOSIS — H353231 Exudative age-related macular degeneration, bilateral, with active choroidal neovascularization: Secondary | ICD-10-CM | POA: Diagnosis not present

## 2023-11-25 DIAGNOSIS — I10 Essential (primary) hypertension: Secondary | ICD-10-CM | POA: Diagnosis not present

## 2023-12-01 ENCOUNTER — Ambulatory Visit (INDEPENDENT_AMBULATORY_CARE_PROVIDER_SITE_OTHER)

## 2023-12-01 VITALS — Ht 67.0 in | Wt 157.0 lb

## 2023-12-01 DIAGNOSIS — Z0000 Encounter for general adult medical examination without abnormal findings: Secondary | ICD-10-CM

## 2023-12-01 NOTE — Progress Notes (Signed)
 Subjective:   Natasha Mendoza is a 78 y.o. who presents for a Medicare Wellness preventive visit.  As a reminder, Annual Wellness Visits don't include a physical exam, and some assessments may be limited, especially if this visit is performed virtually. We may recommend an in-person follow-up visit with your provider if needed.  Visit Complete: Virtual I connected with  Natasha Mendoza on 12/01/23 by a audio enabled telemedicine application and verified that I am speaking with the correct person using two identifiers.  Patient Location: Home  Provider Location: Office/Clinic  I discussed the limitations of evaluation and management by telemedicine. The patient expressed understanding and agreed to proceed.  Vital Signs: Because this visit was a virtual/telehealth visit, some criteria may be missing or patient reported. Any vitals not documented were not able to be obtained and vitals that have been documented are patient reported.  VideoDeclined- This patient declined Librarian, academic. Therefore the visit was completed with audio only.  Persons Participating in Visit: Patient.  AWV Questionnaire: Yes: Patient Medicare AWV questionnaire was completed by the patient on 11/28/23; I have confirmed that all information answered by patient is correct and no changes since this date.  Cardiac Risk Factors include: advanced age (>54men, >80 women);dyslipidemia;hypertension     Objective:    Today's Vitals   12/01/23 1303  Weight: 157 lb (71.2 kg)  Height: 5' 7 (1.702 m)   Body mass index is 24.59 kg/m.     12/01/2023    1:11 PM 09/07/2020    2:45 PM 12/09/2018   11:53 AM  Advanced Directives  Does Patient Have a Medical Advance Directive? Yes Yes No  Type of Estate agent of [place];Living will Living will;Healthcare Power of Attorney   Does patient want to make changes to medical advance directive?  No - Patient declined    Copy of Healthcare Power of Attorney in Chart? No - copy requested No - copy requested     Current Medications (verified) Outpatient Encounter Medications as of 12/01/2023  Medication Sig   aspirin 81 MG tablet Take 81 mg by mouth daily.   cholecalciferol (VITAMIN D3) 25 MCG (1000 UT) tablet Take 2,000 Units by mouth daily.   ciprofloxacin (CILOXAN) 0.3 % ophthalmic solution SMARTSIG:In Eye(s)   Docusate Sodium (COLACE PO) Take 1 tablet by mouth daily.   ketorolac (ACULAR) 0.5 % ophthalmic solution 1 drop daily as needed.   losartan  (COZAAR ) 50 MG tablet TAKE 1 TABLET BY MOUTH DAILY   Multiple Vitamins-Minerals (PRESERVISION AREDS 2) CAPS Take 1 tablet by mouth 2 (two) times a day.    polyethylene glycol powder (GLYCOLAX /MIRALAX ) powder Take 17 g by mouth 2 (two) times daily as needed.   simvastatin  (ZOCOR ) 20 MG tablet TAKE 1 TABLET BY MOUTH AT BEDTIME   fluticasone  (FLONASE ) 50 MCG/ACT nasal spray Place 2 sprays into both nostrils daily.   meclizine  (ANTIVERT ) 12.5 MG tablet Take 1/2 to 1 pill by mouth up to three times daily as needed for dizziness/vertigo   No facility-administered encounter medications on file as of 12/01/2023.    Allergies (verified) Erythromycin   History: Past Medical History:  Diagnosis Date   Constipation    Hemorrhoids    Hyperlipidemia    Hypertension    Past Surgical History:  Procedure Laterality Date   CHOLECYSTECTOMY  06/10/1990   EYE SURGERY  Cataract   Right benign breast cyst  06/11/1995   TONSILLECTOMY     TUBAL LIGATION  Family History  Problem Relation Age of Onset   Heart attack Father 30       massive   Hyperlipidemia Father    Stroke Mother    Cancer Paternal Aunt        breast   Diabetes Neg Hx    Colon cancer Neg Hx    Social History   Socioeconomic History   Marital status: Married    Spouse name: Not on file   Number of children: 3   Years of education: Not on file   Highest education level: 12th grade   Occupational History   Occupation: Retired   Tobacco Use   Smoking status: Never   Smokeless tobacco: Never  Vaping Use   Vaping status: Never Used  Substance and Sexual Activity   Alcohol use: Yes    Alcohol/week: 1.0 standard drink of alcohol    Types: 1 Glasses of wine per week    Comment: occasionally   Drug use: No   Sexual activity: Not Currently  Other Topics Concern   Not on file  Social History Narrative   Exercise: elliptical 4-5 days a week at [facility].    Healthy eating habits, no fried foods.   Has HCPOA: husband or daughter, Has living will. Full code (Reviewed 2015)   2 caffeine drinks daily    Social Drivers of Health   Financial Resource Strain: Low Risk  (11/28/2023)   Overall Financial Resource Strain (CARDIA)    Difficulty of Paying Living Expenses: Not hard at all  Food Insecurity: No Food Insecurity (11/28/2023)   Hunger Vital Sign    Worried About Running Out of Food in the Last Year: Never true    Ran Out of Food in the Last Year: Never true  Transportation Needs: No Transportation Needs (12/01/2023)   PRAPARE - Administrator, Civil Service (Medical): No    Lack of Transportation (Non-Medical): No  Physical Activity: Sufficiently Active (11/28/2023)   Exercise Vital Sign    Days of Exercise per Week: 5 days    Minutes of Exercise per Session: 60 min  Stress: No Stress Concern Present (11/28/2023)   [employer] of Occupational Health - Occupational Stress Questionnaire    Feeling of Stress: Not at all  Social Connections: Socially Integrated (11/28/2023)   Social Connection and Isolation Panel    Frequency of Communication with Friends and Family: Three times a week    Frequency of Social Gatherings with Friends and Family: Twice a week    Attends Religious Services: More than 4 times per year    Active Member of [employer] or Organizations: Yes    Attends Engineer, structural: More than 4 times per year    Marital Status: Married     Tobacco Counseling Counseling given: Not Answered    Clinical Intake:  Pre-visit preparation completed: Yes  Pain : No/denies pain     BMI - recorded: 24.59 Nutritional Status: BMI of 19-24  Normal Nutritional Risks: None Diabetes: No  Lab Results  Component Value Date   HGBA1C 6.1 10/13/2023   HGBA1C 6.1 08/19/2022   HGBA1C 5.8 08/02/2021     How often do you need to have someone help you when you read instructions, pamphlets, or other written materials from your doctor or pharmacy?: 1 - Never  Interpreter Needed?: No  Comments: lives with husband Information entered by :: B.Ozro Russett,LPN   Activities of Daily Living     11/28/2023    8:17 AM  In your present state of health, do you have any difficulty performing the following activities:  Hearing? 0  Vision? 1  Difficulty concentrating or making decisions? 0  Walking or climbing stairs? 0  Dressing or bathing? 0  Doing errands, shopping? 0  Preparing Food and eating ? N  Using the Toilet? N  In the past six months, have you accidently leaked urine? N  Do you have problems with loss of bowel control? N  Managing your Medications? N  Managing your Finances? N  Housekeeping or managing your Housekeeping? N    Patient Care Team: Tower, Laine LABOR, MD as PCP - General (Family Medicine) Alvia Norleen BIRCH, MD as Consulting Physician (Ophthalmology) Portia Fireman, OD (Optometry)  I have updated your Care Teams any recent Medical Services you may have received from other providers in the past year.     Assessment:   This is a routine wellness examination for [facility].  Hearing/Vision screen Hearing Screening - Comments:: Pt says her hearing is good Vision Screening - Comments:: Pt says her vision is blurred:has MCD;has glasses:cannot see up close Dr Thom Matthews/Dr Portia   Goals Addressed             This Visit's Progress    Follow up with Primary Care Provider   On track    As needed      Patient Stated       Stay active, mobile, keep your weight eye and management my eye sight       Depression Screen     12/01/2023    1:09 PM 10/27/2023   11:23 AM 10/13/2023    3:39 PM 10/03/2022    2:50 PM 08/21/2022    1:36 PM 08/14/2021   10:20 AM 09/07/2020    1:11 PM  PHQ 2/9 Scores  PHQ - 2 Score 0 0 0 0 0 0 0  PHQ- 9 Score  0 0 0 0      Fall Risk     11/28/2023    8:17 AM 10/27/2023   11:23 AM 10/13/2023    3:39 PM 10/03/2022    2:50 PM 08/21/2022    1:36 PM  Fall Risk   Falls in the past year? 0 0 0 0 0  Number falls in past yr:  0 0 0 0  Injury with Fall?  0 0 0 0  Risk for fall due to : No Fall Risks No Fall Risks No Fall Risks No Fall Risks No Fall Risks  Follow up Education provided;Falls prevention discussed Falls evaluation completed Falls evaluation completed Falls evaluation completed Falls evaluation completed    MEDICARE RISK AT HOME:  Medicare Risk at Home Any stairs in or around the home?: (Patient-Rptd) No If so, are there any without handrails?: (Patient-Rptd) No Home free of loose throw rugs in walkways, pet beds, electrical cords, etc?: (Patient-Rptd) Yes Adequate lighting in your home to reduce risk of falls?: (Patient-Rptd) Yes Life alert?: (Patient-Rptd) No Use of a cane, walker or w/c?: (Patient-Rptd) No Grab bars in the bathroom?: (Patient-Rptd) No Shower chair or bench in shower?: (Patient-Rptd) No Elevated toilet seat or a handicapped toilet?: (Patient-Rptd) Yes  TIMED UP AND GO:  Was the test performed?  No  Cognitive Function: 6CIT completed        12/01/2023    1:12 PM  6CIT Screen  What Year? 0 points  What month? 0 points  What time? 0 points  Count back from 20 0 points  Months in  reverse 0 points  Repeat phrase 0 points  Total Score 0 points    Immunizations Immunization History  Administered Date(s) Administered   PFIZER(Purple Top)SARS-COV-2 Vaccination 07/16/2019    Screening Tests Health Maintenance  Topic Date Due    DTaP/Tdap/Td (1 - Tdap) 10/12/2024 (Originally 04/25/1965)   INFLUENZA VACCINE  01/09/2024   MAMMOGRAM  10/12/2024   Medicare Annual Wellness (AWV)  11/30/2024   DEXA SCAN  Completed   HPV VACCINES  Aged Out   Meningococcal B Vaccine  Aged Out   Pneumococcal Vaccine: 50+ Years  Discontinued   COVID-19 Vaccine  Discontinued   Fecal DNA (Cologuard)  Discontinued   Zoster Vaccines- Shingrix  Discontinued    Health Maintenance  There are no preventive care reminders to display for this patient.  Health Maintenance Items Addressed: None at this time  Additional Screening:  Vision Screening: Recommended annual ophthalmology exams for early detection of glaucoma and other disorders of the eye. Would you like a referral to an eye doctor? No    Dental Screening: Recommended annual dental exams for proper oral hygiene  Community Resource Referral / Chronic Care Management: CRR required this visit?  No   CCM required this visit?  No   Plan:    I have personally reviewed and noted the following in the patient's chart:   Medical and social history Use of alcohol, tobacco or illicit drugs  Current medications and supplements including opioid prescriptions. Patient is not currently taking opioid prescriptions. Functional ability and status Nutritional status Physical activity Advanced directives List of other physicians Hospitalizations, surgeries, and ER visits in previous 12 months Vitals Screenings to include cognitive, depression, and falls Referrals and appointments  In addition, I have reviewed and discussed with patient certain preventive protocols, quality metrics, and best practice recommendations. A written personalized care plan for preventive services as well as general preventive health recommendations were provided to patient.   Erminio LITTIE Saris, LPN   [id number]   After Visit Summary: (MyChart) Due to this being a telephonic visit, the after visit summary  with patients personalized plan was offered to patient via MyChart   Notes: Nothing significant to report at this time.

## 2023-12-01 NOTE — Patient Instructions (Signed)
 Natasha Mendoza , Thank you for taking time out of your busy schedule to complete your Annual Wellness Visit with me. I enjoyed our conversation and look forward to speaking with you again next year. I, as well as your care team,  appreciate your ongoing commitment to your health goals. Please review the following plan we discussed and let me know if I can assist you in the future. Your Game plan/ To Do List    Follow up Visits: Next Medicare AWV with our clinical staff: 12/02/24 @ 1pm televisit   Have you seen your provider in the last 6 months (3 months if uncontrolled diabetes)? Yes Next Office Visit with your provider: pt will schedule PE for next year in Dec25  Clinician Recommendations:  Aim for 30 minutes of exercise or brisk walking, 6-8 glasses of water, and 5 servings of fruits and vegetables each day.       This is a list of the screening recommended for you and due dates:  Health Maintenance  Topic Date Due   DTaP/Tdap/Td vaccine (1 - Tdap) 10/12/2024*   Flu Shot  01/09/2024   Mammogram  10/12/2024   Medicare Annual Wellness Visit  11/30/2024   DEXA scan (bone density measurement)  Completed   HPV Vaccine  Aged Out   Meningitis B Vaccine  Aged Out   Pneumococcal Vaccine for age over 66  Discontinued   COVID-19 Vaccine  Discontinued   Cologuard (Stool DNA test)  Discontinued   Zoster (Shingles) Vaccine  Discontinued  *Topic was postponed. The date shown is not the original due date.    Advanced directives: (Copy Requested) Please bring a copy of your health care power of attorney and living will to the office to be added to your chart at your convenience. You can mail to [facility] [address] [place], [place] [address] or email to [email or web address] Advance Care Planning is important because it:  [x]  Makes sure you receive the medical care that is consistent with your values, goals, and preferences  [x]  It provides guidance to your family and  loved ones and reduces their decisional burden about whether or not they are making the right decisions based on your wishes.  Follow the link provided in your after visit summary or read over the paperwork we have mailed to you to help you started getting your Advance Directives in place. If you need assistance in completing these, please reach out to us  so that we can help you!

## 2023-12-05 ENCOUNTER — Ambulatory Visit

## 2023-12-30 ENCOUNTER — Encounter (INDEPENDENT_AMBULATORY_CARE_PROVIDER_SITE_OTHER): Admitting: Ophthalmology

## 2023-12-30 DIAGNOSIS — H353231 Exudative age-related macular degeneration, bilateral, with active choroidal neovascularization: Secondary | ICD-10-CM

## 2023-12-30 DIAGNOSIS — I10 Essential (primary) hypertension: Secondary | ICD-10-CM | POA: Diagnosis not present

## 2023-12-30 DIAGNOSIS — H43813 Vitreous degeneration, bilateral: Secondary | ICD-10-CM | POA: Diagnosis not present

## 2023-12-30 DIAGNOSIS — H35033 Hypertensive retinopathy, bilateral: Secondary | ICD-10-CM | POA: Diagnosis not present

## 2024-02-10 ENCOUNTER — Encounter (INDEPENDENT_AMBULATORY_CARE_PROVIDER_SITE_OTHER): Admitting: Ophthalmology

## 2024-02-10 DIAGNOSIS — H353231 Exudative age-related macular degeneration, bilateral, with active choroidal neovascularization: Secondary | ICD-10-CM | POA: Diagnosis not present

## 2024-02-10 DIAGNOSIS — H35033 Hypertensive retinopathy, bilateral: Secondary | ICD-10-CM

## 2024-02-10 DIAGNOSIS — I10 Essential (primary) hypertension: Secondary | ICD-10-CM

## 2024-02-10 DIAGNOSIS — H43813 Vitreous degeneration, bilateral: Secondary | ICD-10-CM | POA: Diagnosis not present

## 2024-02-23 ENCOUNTER — Ambulatory Visit: Admitting: Obstetrics and Gynecology

## 2024-02-23 VITALS — BP 134/72 | HR 75

## 2024-02-23 DIAGNOSIS — N812 Incomplete uterovaginal prolapse: Secondary | ICD-10-CM

## 2024-02-23 DIAGNOSIS — N952 Postmenopausal atrophic vaginitis: Secondary | ICD-10-CM

## 2024-02-23 DIAGNOSIS — N816 Rectocele: Secondary | ICD-10-CM

## 2024-02-23 DIAGNOSIS — N8110 Cystocele, unspecified: Secondary | ICD-10-CM

## 2024-02-23 NOTE — Progress Notes (Signed)
 [facility]   Subjective:     Chief Complaint:  Chief Complaint  Patient presents with   Pessary Check    Natasha Mendoza is a 78 y.o. female is here for pessary check.   History of Present Illness: Natasha Mendoza is a 78 y.o. female with stage II pelvic organ prolapse who presents for a pessary check. She is using a size #5 short stem gellhorn pessary. The pessary has been working well but she reports she has been having to push the pessary back up at times and she can feel the prolpse coming around the pessary. She is not using vaginal estrogen. She denies vaginal bleeding. She reports the vaginal discharge has decreased significantly with using boric acid suppositories x1 weekly.   Past Medical History: Patient  has a past medical history of Constipation, Hemorrhoids, Hyperlipidemia, and Hypertension.   Past Surgical History: She  has a past surgical history that includes Cholecystectomy (06/10/1990); Right benign breast cyst (06/11/1995); Tonsillectomy; Tubal ligation; and Eye surgery (Cataract).   Medications: She has a current medication list which includes the following prescription(s): aspirin, cholecalciferol, ciprofloxacin, docusate sodium, ketorolac, losartan , preservision areds 2, polyethylene glycol powder, and simvastatin .   Allergies: Patient is allergic to erythromycin.   Social History: Patient  reports that she has never smoked. She has never used smokeless tobacco. She reports current alcohol use of about 1.0 standard drink of alcohol per week. She reports that she does not use drugs.      Objective:    Physical Exam: BP 134/72   Pulse 75  Gen: No apparent distress, A&O x 3. Detailed Urogynecologic Evaluation:  Pelvic Exam: Normal external female genitalia; Bartholin's and Skene's glands normal in appearance; urethral meatus with caruncle, no urethral masses or discharge. The pessary was noted to be dislodged and was pushing the short  stem into the posterior wall. It was removed and cleaned. Speculum exam revealed no lesions in the vagina. The pessary was changed to a #6 long stem gellhorn pessary. This was comfortable to the patient and fit well. She was able to urinate without difficulty.     Assessment/Plan:    Assessment: Natasha Mendoza is a 78 y.o. with stage II pelvic organ prolapse here for a pessary check. She is doing well.  Plan: She will keep the pessary in place until next visit. She will continue to use boric acid as needed for vaginal discharge. She will follow-up in 3 months for a pessary check or sooner as needed.

## 2024-03-23 ENCOUNTER — Encounter (INDEPENDENT_AMBULATORY_CARE_PROVIDER_SITE_OTHER): Admitting: Ophthalmology

## 2024-03-23 DIAGNOSIS — I10 Essential (primary) hypertension: Secondary | ICD-10-CM | POA: Diagnosis not present

## 2024-03-23 DIAGNOSIS — H43813 Vitreous degeneration, bilateral: Secondary | ICD-10-CM

## 2024-03-23 DIAGNOSIS — H353231 Exudative age-related macular degeneration, bilateral, with active choroidal neovascularization: Secondary | ICD-10-CM

## 2024-03-23 DIAGNOSIS — H35033 Hypertensive retinopathy, bilateral: Secondary | ICD-10-CM | POA: Diagnosis not present

## 2024-04-27 ENCOUNTER — Other Ambulatory Visit: Payer: Self-pay | Admitting: *Deleted

## 2024-04-27 DIAGNOSIS — I10 Essential (primary) hypertension: Secondary | ICD-10-CM

## 2024-04-27 MED ORDER — SIMVASTATIN 20 MG PO TABS: 20.0000 mg | ORAL_TABLET | Freq: Every day | ORAL | 2 refills | Status: AC

## 2024-04-27 MED ORDER — LOSARTAN POTASSIUM 50 MG PO TABS: 50.0000 mg | ORAL_TABLET | Freq: Every day | ORAL | 2 refills | Status: AC

## 2024-05-11 ENCOUNTER — Encounter (INDEPENDENT_AMBULATORY_CARE_PROVIDER_SITE_OTHER): Admitting: Ophthalmology

## 2024-05-11 DIAGNOSIS — I10 Essential (primary) hypertension: Secondary | ICD-10-CM | POA: Diagnosis not present

## 2024-05-11 DIAGNOSIS — H35033 Hypertensive retinopathy, bilateral: Secondary | ICD-10-CM

## 2024-05-11 DIAGNOSIS — H353231 Exudative age-related macular degeneration, bilateral, with active choroidal neovascularization: Secondary | ICD-10-CM | POA: Diagnosis not present

## 2024-05-11 DIAGNOSIS — H43813 Vitreous degeneration, bilateral: Secondary | ICD-10-CM

## 2024-05-24 ENCOUNTER — Encounter: Payer: Self-pay | Admitting: Obstetrics and Gynecology

## 2024-05-24 ENCOUNTER — Ambulatory Visit: Admitting: Obstetrics and Gynecology

## 2024-05-24 VITALS — BP 148/77 | HR 73

## 2024-05-24 DIAGNOSIS — N812 Incomplete uterovaginal prolapse: Secondary | ICD-10-CM

## 2024-05-24 DIAGNOSIS — Z960 Presence of urogenital implants: Secondary | ICD-10-CM

## 2024-05-24 DIAGNOSIS — N8110 Cystocele, unspecified: Secondary | ICD-10-CM

## 2024-05-24 DIAGNOSIS — N952 Postmenopausal atrophic vaginitis: Secondary | ICD-10-CM

## 2024-05-24 DIAGNOSIS — N816 Rectocele: Secondary | ICD-10-CM

## 2024-05-24 NOTE — Progress Notes (Signed)
 [facility]   Subjective:     Chief Complaint: No chief complaint on file.  History of Present Illness: Ebonee Stober is a 78 y.o. female with stage II pelvic organ prolapse who presents for a pessary check. She is using a size #6 long stem gellhorn pessary. The pessary has been working well and she has no complaints. She is using vaginal estrogen. She denies vaginal bleeding.  Past Medical History: Patient  has a past medical history of Constipation, Hemorrhoids, Hyperlipidemia, and Hypertension.   Past Surgical History: She  has a past surgical history that includes Cholecystectomy (06/10/1990); Right benign breast cyst (06/11/1995); Tonsillectomy; Tubal ligation; and Eye surgery (Cataract).   Medications: She has a current medication list which includes the following prescription(s): aspirin, cholecalciferol, ciprofloxacin, docusate sodium, ketorolac, losartan , preservision areds 2, polyethylene glycol powder, and simvastatin .   Allergies: Patient is allergic to erythromycin.   Social History: Patient  reports that she has never smoked. She has never used smokeless tobacco. She reports current alcohol use of about 1.0 standard drink of alcohol per week. She reports that she does not use drugs.      Objective:    Physical Exam: BP (!) 148/77   Pulse 73  Gen: No apparent distress, A&O x 3. Detailed Urogynecologic Evaluation:  Pelvic Exam: Normal external female genitalia; Bartholin's and Skene's glands normal in appearance; urethral meatus with caruncle, no urethral masses or discharge. The pessary was noted to be in place. It was removed and cleaned. Speculum exam revealed no lesions in the vagina. The pessary was replaced. It was comfortable to the patient and fit well.   Assessment/Plan:    Assessment: Ms. Brien is a 78 y.o. with stage II pelvic organ prolapse here for a pessary check. She is doing well.  Plan: She will keep the pessary in place  until next visit. She will continue to use estrogen. She will follow-up in 3 months for a pessary check or sooner as needed.  All questions were answered.

## 2024-06-21 ENCOUNTER — Encounter: Payer: Self-pay | Admitting: *Deleted

## 2024-07-06 ENCOUNTER — Encounter (INDEPENDENT_AMBULATORY_CARE_PROVIDER_SITE_OTHER): Admitting: Ophthalmology

## 2024-07-06 DIAGNOSIS — H35033 Hypertensive retinopathy, bilateral: Secondary | ICD-10-CM | POA: Diagnosis not present

## 2024-07-06 DIAGNOSIS — H43813 Vitreous degeneration, bilateral: Secondary | ICD-10-CM

## 2024-07-06 DIAGNOSIS — H353231 Exudative age-related macular degeneration, bilateral, with active choroidal neovascularization: Secondary | ICD-10-CM | POA: Diagnosis not present

## 2024-07-06 DIAGNOSIS — I10 Essential (primary) hypertension: Secondary | ICD-10-CM | POA: Diagnosis not present

## 2024-08-31 ENCOUNTER — Encounter (INDEPENDENT_AMBULATORY_CARE_PROVIDER_SITE_OTHER): Admitting: Ophthalmology

## 2024-09-01 ENCOUNTER — Ambulatory Visit: Admitting: Obstetrics and Gynecology

## 2024-10-25 ENCOUNTER — Encounter: Admitting: Family Medicine

## 2024-12-02 ENCOUNTER — Ambulatory Visit
